# Patient Record
Sex: Female | Born: 1940 | Race: White | Hispanic: No | Marital: Married | Smoking: Never smoker
Health system: Southern US, Community
[De-identification: ages and names within clinical notes are randomized; demographics above are authoritative.]

## PROBLEM LIST (undated history)

## (undated) DIAGNOSIS — R0600 Dyspnea, unspecified: Secondary | ICD-10-CM

## (undated) DIAGNOSIS — M5030 Other cervical disc degeneration, unspecified cervical region: Secondary | ICD-10-CM

## (undated) DIAGNOSIS — F32A Depression, unspecified: Secondary | ICD-10-CM

## (undated) DIAGNOSIS — M5136 Other intervertebral disc degeneration, lumbar region: Secondary | ICD-10-CM

## (undated) DIAGNOSIS — R0609 Other forms of dyspnea: Secondary | ICD-10-CM

## (undated) DIAGNOSIS — Z87442 Personal history of urinary calculi: Secondary | ICD-10-CM

## (undated) DIAGNOSIS — F419 Anxiety disorder, unspecified: Secondary | ICD-10-CM

## (undated) DIAGNOSIS — E785 Hyperlipidemia, unspecified: Secondary | ICD-10-CM

## (undated) DIAGNOSIS — G5600 Carpal tunnel syndrome, unspecified upper limb: Secondary | ICD-10-CM

## (undated) DIAGNOSIS — H269 Unspecified cataract: Secondary | ICD-10-CM

## (undated) DIAGNOSIS — M8580 Other specified disorders of bone density and structure, unspecified site: Secondary | ICD-10-CM

## (undated) DIAGNOSIS — Z9989 Dependence on other enabling machines and devices: Secondary | ICD-10-CM

## (undated) DIAGNOSIS — M1990 Unspecified osteoarthritis, unspecified site: Secondary | ICD-10-CM

## (undated) DIAGNOSIS — R519 Headache, unspecified: Secondary | ICD-10-CM

## (undated) DIAGNOSIS — G43909 Migraine, unspecified, not intractable, without status migrainosus: Secondary | ICD-10-CM

## (undated) DIAGNOSIS — I10 Essential (primary) hypertension: Secondary | ICD-10-CM

## (undated) DIAGNOSIS — K219 Gastro-esophageal reflux disease without esophagitis: Secondary | ICD-10-CM

## (undated) HISTORY — PX: WISDOM TOOTH EXTRACTION: SHX21

## (undated) HISTORY — PX: UPPER GI ENDOSCOPY: SHX6162

## (undated) HISTORY — PX: CATARACT EXTRACTION, BILATERAL: SHX1313

## (undated) HISTORY — PX: COLONOSCOPY: SHX174

## (undated) HISTORY — PX: CHOLECYSTECTOMY: SHX55

## (undated) HISTORY — PX: TONSILLECTOMY: SUR1361

---

## 2004-12-23 ENCOUNTER — Ambulatory Visit: Payer: Self-pay | Admitting: Anesthesiology

## 2005-01-01 ENCOUNTER — Ambulatory Visit: Payer: Self-pay | Admitting: Family Medicine

## 2005-01-06 ENCOUNTER — Ambulatory Visit: Payer: Self-pay | Admitting: Pain Medicine

## 2005-01-20 ENCOUNTER — Ambulatory Visit: Payer: Self-pay | Admitting: Pain Medicine

## 2005-02-17 ENCOUNTER — Ambulatory Visit: Payer: Self-pay | Admitting: Pain Medicine

## 2005-02-23 ENCOUNTER — Ambulatory Visit: Payer: Self-pay | Admitting: Pain Medicine

## 2005-03-09 ENCOUNTER — Ambulatory Visit: Payer: Self-pay | Admitting: Physician Assistant

## 2005-03-18 ENCOUNTER — Ambulatory Visit: Payer: Self-pay | Admitting: Pain Medicine

## 2005-04-01 ENCOUNTER — Ambulatory Visit: Payer: Self-pay | Admitting: Pain Medicine

## 2005-05-11 ENCOUNTER — Ambulatory Visit: Payer: Self-pay | Admitting: Pain Medicine

## 2005-06-01 ENCOUNTER — Ambulatory Visit: Payer: Self-pay | Admitting: Physician Assistant

## 2005-07-19 ENCOUNTER — Ambulatory Visit: Payer: Self-pay | Admitting: Physician Assistant

## 2005-07-22 ENCOUNTER — Ambulatory Visit: Payer: Self-pay | Admitting: Pain Medicine

## 2005-08-25 ENCOUNTER — Ambulatory Visit: Payer: Self-pay | Admitting: Physician Assistant

## 2005-09-01 ENCOUNTER — Encounter: Payer: Self-pay | Admitting: Pain Medicine

## 2005-09-24 ENCOUNTER — Encounter: Payer: Self-pay | Admitting: Pain Medicine

## 2005-12-07 ENCOUNTER — Ambulatory Visit: Payer: Self-pay | Admitting: Cardiovascular Disease

## 2006-01-06 ENCOUNTER — Ambulatory Visit: Payer: Self-pay | Admitting: Internal Medicine

## 2006-02-01 ENCOUNTER — Ambulatory Visit: Payer: Self-pay | Admitting: Physician Assistant

## 2006-03-17 ENCOUNTER — Ambulatory Visit: Payer: Self-pay | Admitting: Physician Assistant

## 2006-03-22 ENCOUNTER — Ambulatory Visit: Payer: Self-pay | Admitting: Pain Medicine

## 2006-04-06 ENCOUNTER — Ambulatory Visit: Payer: Self-pay | Admitting: Pain Medicine

## 2006-06-03 ENCOUNTER — Ambulatory Visit: Payer: Self-pay | Admitting: Physician Assistant

## 2006-06-15 ENCOUNTER — Ambulatory Visit: Payer: Self-pay | Admitting: Internal Medicine

## 2006-07-27 ENCOUNTER — Ambulatory Visit: Payer: Self-pay | Admitting: Gastroenterology

## 2006-12-06 ENCOUNTER — Ambulatory Visit: Payer: Self-pay | Admitting: Physician Assistant

## 2006-12-27 ENCOUNTER — Ambulatory Visit: Payer: Self-pay | Admitting: Physician Assistant

## 2007-01-09 ENCOUNTER — Ambulatory Visit: Payer: Self-pay | Admitting: Internal Medicine

## 2007-01-25 ENCOUNTER — Ambulatory Visit: Payer: Self-pay | Admitting: Physician Assistant

## 2007-02-07 ENCOUNTER — Ambulatory Visit: Payer: Self-pay | Admitting: Pain Medicine

## 2007-02-08 ENCOUNTER — Encounter: Payer: Self-pay | Admitting: Pain Medicine

## 2007-02-22 ENCOUNTER — Ambulatory Visit: Payer: Self-pay | Admitting: Pain Medicine

## 2007-02-23 ENCOUNTER — Encounter: Payer: Self-pay | Admitting: Pain Medicine

## 2007-02-28 ENCOUNTER — Ambulatory Visit: Payer: Self-pay | Admitting: Pain Medicine

## 2007-03-15 ENCOUNTER — Ambulatory Visit: Payer: Self-pay | Admitting: Physician Assistant

## 2007-11-15 ENCOUNTER — Ambulatory Visit: Payer: Self-pay | Admitting: Physician Assistant

## 2007-11-17 ENCOUNTER — Other Ambulatory Visit: Payer: Self-pay

## 2007-11-17 ENCOUNTER — Emergency Department: Payer: Self-pay | Admitting: Emergency Medicine

## 2007-11-28 ENCOUNTER — Ambulatory Visit: Payer: Self-pay | Admitting: Pain Medicine

## 2007-12-13 ENCOUNTER — Ambulatory Visit: Payer: Self-pay | Admitting: Pain Medicine

## 2007-12-20 ENCOUNTER — Ambulatory Visit: Payer: Self-pay | Admitting: Pain Medicine

## 2008-01-01 ENCOUNTER — Ambulatory Visit: Payer: Self-pay | Admitting: Internal Medicine

## 2008-01-17 ENCOUNTER — Ambulatory Visit: Payer: Self-pay | Admitting: Pain Medicine

## 2008-01-23 ENCOUNTER — Ambulatory Visit: Payer: Self-pay | Admitting: Pain Medicine

## 2008-02-07 ENCOUNTER — Ambulatory Visit: Payer: Self-pay | Admitting: Pain Medicine

## 2008-03-04 ENCOUNTER — Ambulatory Visit: Payer: Self-pay | Admitting: Pain Medicine

## 2008-04-02 ENCOUNTER — Ambulatory Visit: Payer: Self-pay | Admitting: Physician Assistant

## 2008-05-07 ENCOUNTER — Ambulatory Visit: Payer: Self-pay | Admitting: Physician Assistant

## 2008-05-16 ENCOUNTER — Ambulatory Visit: Payer: Self-pay | Admitting: Pain Medicine

## 2008-05-30 ENCOUNTER — Ambulatory Visit: Payer: Self-pay | Admitting: Physician Assistant

## 2008-06-13 ENCOUNTER — Ambulatory Visit: Payer: Self-pay | Admitting: Pain Medicine

## 2008-06-27 ENCOUNTER — Ambulatory Visit: Payer: Self-pay | Admitting: Pain Medicine

## 2008-07-11 ENCOUNTER — Ambulatory Visit: Payer: Self-pay | Admitting: Physician Assistant

## 2008-07-18 ENCOUNTER — Ambulatory Visit: Payer: Self-pay | Admitting: Pain Medicine

## 2008-08-06 ENCOUNTER — Ambulatory Visit: Payer: Self-pay | Admitting: Physician Assistant

## 2008-09-17 ENCOUNTER — Encounter: Payer: Self-pay | Admitting: Obstetrics and Gynecology

## 2008-09-24 ENCOUNTER — Encounter: Payer: Self-pay | Admitting: Obstetrics and Gynecology

## 2008-10-25 ENCOUNTER — Encounter: Payer: Self-pay | Admitting: Obstetrics and Gynecology

## 2008-11-25 ENCOUNTER — Encounter: Payer: Self-pay | Admitting: Obstetrics and Gynecology

## 2008-12-23 ENCOUNTER — Encounter: Payer: Self-pay | Admitting: Obstetrics and Gynecology

## 2009-01-23 ENCOUNTER — Encounter: Payer: Self-pay | Admitting: Obstetrics and Gynecology

## 2009-02-22 ENCOUNTER — Encounter: Payer: Self-pay | Admitting: Obstetrics and Gynecology

## 2009-03-25 ENCOUNTER — Ambulatory Visit: Payer: Self-pay | Admitting: Internal Medicine

## 2009-07-08 ENCOUNTER — Ambulatory Visit: Payer: Self-pay | Admitting: Physician Assistant

## 2009-09-11 ENCOUNTER — Ambulatory Visit: Payer: Self-pay | Admitting: Physician Assistant

## 2010-03-27 ENCOUNTER — Ambulatory Visit: Payer: Self-pay | Admitting: Internal Medicine

## 2010-08-31 ENCOUNTER — Ambulatory Visit: Payer: Self-pay | Admitting: Pain Medicine

## 2011-03-01 ENCOUNTER — Ambulatory Visit: Payer: Self-pay | Admitting: Pain Medicine

## 2011-03-08 ENCOUNTER — Ambulatory Visit: Payer: Self-pay | Admitting: Pain Medicine

## 2011-03-11 ENCOUNTER — Ambulatory Visit: Payer: Self-pay | Admitting: Pain Medicine

## 2011-03-29 ENCOUNTER — Ambulatory Visit: Payer: Self-pay | Admitting: Internal Medicine

## 2011-04-05 ENCOUNTER — Ambulatory Visit: Payer: Self-pay | Admitting: Pain Medicine

## 2011-06-21 ENCOUNTER — Ambulatory Visit: Payer: Self-pay | Admitting: Ophthalmology

## 2011-06-29 ENCOUNTER — Ambulatory Visit: Payer: Self-pay | Admitting: Ophthalmology

## 2011-07-12 ENCOUNTER — Ambulatory Visit: Payer: Self-pay | Admitting: Ophthalmology

## 2011-11-08 ENCOUNTER — Ambulatory Visit: Payer: Self-pay | Admitting: Pain Medicine

## 2012-03-06 ENCOUNTER — Ambulatory Visit: Payer: Self-pay | Admitting: Pain Medicine

## 2012-08-29 ENCOUNTER — Ambulatory Visit: Payer: Self-pay | Admitting: Pain Medicine

## 2012-09-18 ENCOUNTER — Ambulatory Visit: Payer: Self-pay | Admitting: Internal Medicine

## 2013-10-09 ENCOUNTER — Ambulatory Visit: Payer: Self-pay | Admitting: Internal Medicine

## 2014-10-23 ENCOUNTER — Ambulatory Visit: Payer: Self-pay | Admitting: Internal Medicine

## 2014-12-06 ENCOUNTER — Ambulatory Visit: Payer: Self-pay | Admitting: Unknown Physician Specialty

## 2015-10-28 ENCOUNTER — Other Ambulatory Visit: Payer: Self-pay | Admitting: Internal Medicine

## 2015-10-28 DIAGNOSIS — Z1231 Encounter for screening mammogram for malignant neoplasm of breast: Secondary | ICD-10-CM

## 2015-11-07 ENCOUNTER — Ambulatory Visit: Payer: Self-pay | Attending: Internal Medicine

## 2016-05-13 ENCOUNTER — Ambulatory Visit: Payer: Self-pay

## 2016-06-01 ENCOUNTER — Ambulatory Visit
Admission: RE | Admit: 2016-06-01 | Discharge: 2016-06-01 | Disposition: A | Discharge status: Home / Self Care | Payer: Medicare Other | Source: Ambulatory Visit | Attending: Internal Medicine | Admitting: Internal Medicine

## 2016-06-01 ENCOUNTER — Other Ambulatory Visit: Payer: Self-pay | Admitting: Internal Medicine

## 2016-06-01 DIAGNOSIS — Z1231 Encounter for screening mammogram for malignant neoplasm of breast: Secondary | ICD-10-CM | POA: Insufficient documentation

## 2017-03-29 DIAGNOSIS — D229 Melanocytic nevi, unspecified: Secondary | ICD-10-CM

## 2017-03-29 HISTORY — DX: Melanocytic nevi, unspecified: D22.9

## 2017-05-11 ENCOUNTER — Other Ambulatory Visit: Payer: Self-pay | Admitting: Internal Medicine

## 2017-05-11 DIAGNOSIS — Z1231 Encounter for screening mammogram for malignant neoplasm of breast: Secondary | ICD-10-CM

## 2017-08-01 ENCOUNTER — Ambulatory Visit
Admission: RE | Admit: 2017-08-01 | Discharge: 2017-08-01 | Disposition: A | Discharge status: Home / Self Care | Payer: Medicare Other | Source: Ambulatory Visit | Attending: Internal Medicine | Admitting: Internal Medicine

## 2017-08-01 DIAGNOSIS — Z1231 Encounter for screening mammogram for malignant neoplasm of breast: Secondary | ICD-10-CM | POA: Diagnosis not present

## 2018-07-31 ENCOUNTER — Other Ambulatory Visit: Payer: Self-pay | Admitting: Internal Medicine

## 2018-07-31 DIAGNOSIS — Z1231 Encounter for screening mammogram for malignant neoplasm of breast: Secondary | ICD-10-CM

## 2018-08-11 ENCOUNTER — Ambulatory Visit
Admission: RE | Admit: 2018-08-11 | Discharge: 2018-08-11 | Disposition: A | Discharge status: Home / Self Care | Payer: Medicare Other | Source: Ambulatory Visit | Attending: Internal Medicine | Admitting: Internal Medicine

## 2018-08-11 DIAGNOSIS — Z1231 Encounter for screening mammogram for malignant neoplasm of breast: Secondary | ICD-10-CM | POA: Diagnosis present

## 2019-04-10 DIAGNOSIS — C4491 Basal cell carcinoma of skin, unspecified: Secondary | ICD-10-CM

## 2019-04-10 HISTORY — DX: Basal cell carcinoma of skin, unspecified: C44.91

## 2019-07-10 ENCOUNTER — Other Ambulatory Visit: Payer: Self-pay | Admitting: Internal Medicine

## 2019-07-10 DIAGNOSIS — Z1231 Encounter for screening mammogram for malignant neoplasm of breast: Secondary | ICD-10-CM

## 2019-12-11 ENCOUNTER — Ambulatory Visit: Payer: Medicare Other | Attending: Internal Medicine

## 2019-12-11 DIAGNOSIS — Z20822 Contact with and (suspected) exposure to covid-19: Secondary | ICD-10-CM

## 2019-12-12 LAB — NOVEL CORONAVIRUS, NAA: SARS-CoV-2, NAA: NOT DETECTED

## 2020-01-16 ENCOUNTER — Other Ambulatory Visit: Payer: Self-pay | Admitting: Internal Medicine

## 2020-01-16 DIAGNOSIS — H539 Unspecified visual disturbance: Secondary | ICD-10-CM

## 2020-01-16 DIAGNOSIS — R410 Disorientation, unspecified: Secondary | ICD-10-CM

## 2020-01-23 ENCOUNTER — Other Ambulatory Visit: Payer: Self-pay

## 2020-01-23 ENCOUNTER — Ambulatory Visit
Admission: RE | Admit: 2020-01-23 | Discharge: 2020-01-23 | Disposition: A | Discharge status: Home / Self Care | Payer: Medicare Other | Source: Ambulatory Visit | Attending: Internal Medicine | Admitting: Internal Medicine

## 2020-01-23 DIAGNOSIS — R410 Disorientation, unspecified: Secondary | ICD-10-CM | POA: Diagnosis present

## 2020-01-23 DIAGNOSIS — H539 Unspecified visual disturbance: Secondary | ICD-10-CM | POA: Diagnosis present

## 2020-02-05 ENCOUNTER — Ambulatory Visit
Admission: RE | Admit: 2020-02-05 | Discharge: 2020-02-05 | Disposition: A | Discharge status: Home / Self Care | Payer: Medicare Other | Source: Ambulatory Visit | Attending: Internal Medicine | Admitting: Internal Medicine

## 2020-02-05 DIAGNOSIS — Z1231 Encounter for screening mammogram for malignant neoplasm of breast: Secondary | ICD-10-CM | POA: Diagnosis present

## 2020-02-11 ENCOUNTER — Other Ambulatory Visit: Payer: Self-pay | Admitting: Internal Medicine

## 2020-02-11 DIAGNOSIS — R921 Mammographic calcification found on diagnostic imaging of breast: Secondary | ICD-10-CM

## 2020-02-11 DIAGNOSIS — R928 Other abnormal and inconclusive findings on diagnostic imaging of breast: Secondary | ICD-10-CM

## 2020-02-18 ENCOUNTER — Ambulatory Visit
Admission: RE | Admit: 2020-02-18 | Discharge: 2020-02-18 | Disposition: A | Discharge status: Home / Self Care | Payer: Medicare Other | Source: Ambulatory Visit | Attending: Internal Medicine | Admitting: Internal Medicine

## 2020-02-18 DIAGNOSIS — R921 Mammographic calcification found on diagnostic imaging of breast: Secondary | ICD-10-CM | POA: Diagnosis present

## 2020-02-18 DIAGNOSIS — R928 Other abnormal and inconclusive findings on diagnostic imaging of breast: Secondary | ICD-10-CM | POA: Insufficient documentation

## 2020-02-19 ENCOUNTER — Other Ambulatory Visit: Payer: Self-pay | Admitting: Internal Medicine

## 2020-02-19 DIAGNOSIS — R921 Mammographic calcification found on diagnostic imaging of breast: Secondary | ICD-10-CM

## 2020-02-19 DIAGNOSIS — R928 Other abnormal and inconclusive findings on diagnostic imaging of breast: Secondary | ICD-10-CM

## 2020-02-21 ENCOUNTER — Ambulatory Visit
Admission: RE | Admit: 2020-02-21 | Discharge: 2020-02-21 | Disposition: A | Discharge status: Home / Self Care | Payer: Medicare Other | Source: Ambulatory Visit | Attending: Internal Medicine | Admitting: Internal Medicine

## 2020-02-21 DIAGNOSIS — R921 Mammographic calcification found on diagnostic imaging of breast: Secondary | ICD-10-CM | POA: Insufficient documentation

## 2020-02-21 DIAGNOSIS — R928 Other abnormal and inconclusive findings on diagnostic imaging of breast: Secondary | ICD-10-CM | POA: Diagnosis not present

## 2020-02-21 HISTORY — PX: BREAST BIOPSY: SHX20

## 2020-02-25 LAB — SURGICAL PATHOLOGY

## 2020-04-15 ENCOUNTER — Ambulatory Visit (INDEPENDENT_AMBULATORY_CARE_PROVIDER_SITE_OTHER): Payer: Medicare Other | Admitting: Dermatology

## 2020-04-15 ENCOUNTER — Other Ambulatory Visit: Payer: Self-pay

## 2020-04-15 DIAGNOSIS — Z1283 Encounter for screening for malignant neoplasm of skin: Secondary | ICD-10-CM | POA: Diagnosis not present

## 2020-04-15 DIAGNOSIS — L853 Xerosis cutis: Secondary | ICD-10-CM

## 2020-04-15 DIAGNOSIS — L700 Acne vulgaris: Secondary | ICD-10-CM | POA: Diagnosis not present

## 2020-04-15 DIAGNOSIS — L578 Other skin changes due to chronic exposure to nonionizing radiation: Secondary | ICD-10-CM

## 2020-04-15 DIAGNOSIS — D2272 Melanocytic nevi of left lower limb, including hip: Secondary | ICD-10-CM

## 2020-04-15 DIAGNOSIS — L570 Actinic keratosis: Secondary | ICD-10-CM

## 2020-04-15 DIAGNOSIS — D225 Melanocytic nevi of trunk: Secondary | ICD-10-CM

## 2020-04-15 DIAGNOSIS — D2362 Other benign neoplasm of skin of left upper limb, including shoulder: Secondary | ICD-10-CM

## 2020-04-15 DIAGNOSIS — L814 Other melanin hyperpigmentation: Secondary | ICD-10-CM

## 2020-04-15 DIAGNOSIS — D229 Melanocytic nevi, unspecified: Secondary | ICD-10-CM

## 2020-04-15 DIAGNOSIS — L219 Seborrheic dermatitis, unspecified: Secondary | ICD-10-CM | POA: Diagnosis not present

## 2020-04-15 DIAGNOSIS — Z85828 Personal history of other malignant neoplasm of skin: Secondary | ICD-10-CM

## 2020-04-15 DIAGNOSIS — D1800 Hemangioma unspecified site: Secondary | ICD-10-CM

## 2020-04-15 DIAGNOSIS — D239 Other benign neoplasm of skin, unspecified: Secondary | ICD-10-CM

## 2020-04-15 DIAGNOSIS — L821 Other seborrheic keratosis: Secondary | ICD-10-CM

## 2020-04-15 MED ORDER — CLOBETASOL PROPIONATE 0.05 % EX SOLN: CUTANEOUS | 3 refills | Status: DC

## 2020-04-15 MED ORDER — CICLOPIROX 1 % EX SHAM: MEDICATED_SHAMPOO | CUTANEOUS | 3 refills | Status: DC

## 2020-04-15 NOTE — Patient Instructions (Addendum)
Recommend daily broad spectrum sunscreen SPF 30+ to sun-exposed areas, reapply every 2 hours as needed. Call for new or changing lesions.  Seborrheic Dermatitis  What is seborrheic dermatitis? Seborrheic (say: seb-oh-ree-ick) dermatitis is a disease that causes flaking of the skin.  It usually affects the scalp.  In teenagers and adults, it is commonly called "dandruff".  In infants, it is referred to as "cradle cap".  Dandruff often appears as scaling on the scalp with or without redness.  On other parts of the body, seborrheic dermatitis tends to produce both redness and scaling.  Other common locations of seborrheic dermatitis include the central face, eyebrows, chest, and the creases of the arms, legs, and groin.  It often causes the skin to look a little greasy, scaly, or flaky. Seborrheic dermatitis can occur at any age.  It often comes and goes and may to be seasonally related, especially in the Northern climates.  What causes seborrheic dermatitis? The exact cause is not known, though yeast of the Malassezia species may be involved.  This organism is normally present on the skin in small numbers, but sometimes its numbers increase, especially in oily skin.  Treatments that reduce the yeast tend to improve seborrheic dermatitis.  How is seborrheic dermatitis treated? The treatment of seborrheic dermatitis depends on its location on the body and the person's age. Seborrheic dermatitis of the scalp (dandruff) in adults and teenagers is usually treated with a medicated shampoo.  Here is a list of the medications that help, and the over-counter shampoos that contain them:  Salicylic acid (Neutrogena T/Sal, Sebulex, Scalpicin, Denorex Extra Strength)  Zinc pyrithione (Head & Shoulders white bottle, Denorex Daily, DHS Zinc, Pantene Pro-V Pyrithione Zinc)  Selenium sulfide (Head & Shoulders blue bottle, Selsun Blue, Exsel Lotion Shampoo, Glo-Sel)  Yahoo tar (Neutrogena T/Gal, Pentrax, Zetar,  Tegrin, Viacom, Therapeutic Denorex)  Ketoconazole (Nizoral)  If you have dandruff, you might start by using one of these shampoos every day until your dandruff is controlled and then keep using it at least twice a week.  Often times your doctor will recommend a rotation of several different medicated shampoos as some will experience a plateau in the effectiveness of any one shampoo.   When you use a dandruff shampoo, rub the shampoo into your wet hair and massage into scalp thoroughly.  Let it stay on your hair and scalp for 5 minutes before rinsing.  If you have involvement in the eyebrows or face, you can lather those areas with the medicated shampoo as well, or use a medicated soap (ZNP-bar, Polytar Soap, SAStid, or sulfur soap).    If the wash or shampoo alone does not help, your doctor might want you to use a prescription medication once or twice a day.  Leave-in medications for the scalp are best applied by massaging into the scalp immediately after towel drying your hair, but may be applied even if you have not washed your hair.  Seborrheic dermatitis in infants usually clears up by age 5 -24 months.  It may develop in the diaper area where it might be confused with diaper rash.  For milder cases you can try gently brushing out scales with a soft brush.  This is best done immediately after washing with a non-medicated baby shampoo Wynetta Emery and Royce Macadamia, etc.).  Your doctor may recommend a medicated shampoo or a prescription topical medication.

## 2020-04-15 NOTE — Progress Notes (Signed)
Follow-Up Visit   Subjective  [place] is a 79 y.o. female who presents for the following: TBSE.  Patient presents today for annual  TBSE, patient is having itchy scalp with some minor flaking. Patient has a h/o BCC L lower pretibia 04/10/19  The following portions of the chart were reviewed this encounter and updated as appropriate:      Review of Systems:  No other skin or systemic complaints except as noted in HPI or Assessment and Plan.  Objective  Well appearing patient in no apparent distress; mood and affect are within normal limits.  A full examination was performed including scalp, head, eyes, ears, nose, lips, neck, chest, axillae, abdomen, back, buttocks, bilateral upper extremities, bilateral lower extremities, hands, feet, fingers, toes, fingernails, and toenails. All findings within normal limits unless otherwise noted below.  Objective  Right Upper Back: Large open comedon  Objective  mid sternum: Erythematous thin papules/macules with gritty scale.   Objective  Left Shoulder - Anterior: Firm pink/brown papulenodule with dimple sign.   Objective  Right Inferior Breast: 1.3 x 0.9 mm pink brown flat papule R inferior breast  Left inferior  Hip: 2 mm med brown papule  Objective  Right Inferior Breast: 6 mm waxy tan papule darker edge  Objective  Left Lower pretibia: Well healed scar with no evidence of recurrence.   Objective  Mid Frontal Scalp: Mild erythema scalp   Assessment & Plan  Open comedone Right Upper Back  Benign- extracted  AK (actinic keratosis) mid sternum  Cryotherapy today Prior to procedure, discussed risks of blister formation, small wound, skin dyspigmentation, or rare scar following cryotherapy.   Will recheck on follow up  Destruction of lesion - mid sternum  Destruction method: cryotherapy   Informed consent: discussed and consent obtained   Lesion destroyed using liquid nitrogen: Yes   Region frozen  until ice ball extended beyond lesion: Yes   Outcome: patient tolerated procedure well with no complications   Post-procedure details: wound care instructions given    Dermatofibroma Left Shoulder - Anterior  Benign, observe.     Nevus (2) Right Inferior Breast; Left inferior  Hip  Benign-appearing.  Observation.  Call clinic for new or changing moles.  Recommend daily use of broad spectrum spf 30+ sunscreen to sun-exposed areas.     Seborrheic keratosis Right Inferior Breast  Reassured benign age-related growth.  Recommend observation.  Discussed cryotherapy if spot(s) become irritated or inflamed.    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 Observe for recurrence. Call clinic for new or changing lesions.  Recommend regular skin exams, daily broad-spectrum spf 30+ sunscreen use, and photoprotection.     Seborrheic dermatitis Mid Frontal Scalp  improving Continue  Ciclopirox shampoo, may increase frequency Continue Clobetasol Sol. qd/bid prn itch   Lentigines - Scattered tan macules - Discussed due to sun exposure - Benign, observe - Call for any changes  Seborrheic Keratoses - Stuck-on, waxy, tan-brown papules and plaques  - Discussed benign etiology and prognosis. - Observe - Call for any changes  Melanocytic Nevi - Tan-brown and/or pink-flesh-colored symmetric macules and papules - Benign appearing on exam today - Observation - Call clinic for new or changing moles - Recommend daily use of broad spectrum spf 30+ sunscreen to sun-exposed areas.   Hemangiomas - Red papules - Discussed benign nature - Observe - Call for any changes  Actinic Damage - diffuse scaly erythematous macules with underlying dyspigmentation - Recommend daily broad  spectrum sunscreen SPF 30+ to sun-exposed areas, reapply every 2 hours as needed.  - Call for new or changing lesions.  Xerosis - diffuse xerotic patches - recommend gentle, hydrating skin  care - gentle skin care handout given   Skin cancer screening performed today.   Return in about 1 year (around 04/15/2021) for TBSE.  I, Donzetta Kohut, CMA, am acting as scribe for Brendolyn Patty, MD .  Documentation: I have reviewed the above documentation for accuracy and completeness, and I agree with the above.  Brendolyn Patty MD

## 2020-04-30 ENCOUNTER — Other Ambulatory Visit: Payer: Self-pay | Admitting: Otolaryngology

## 2020-04-30 DIAGNOSIS — K219 Gastro-esophageal reflux disease without esophagitis: Secondary | ICD-10-CM

## 2020-04-30 DIAGNOSIS — R1312 Dysphagia, oropharyngeal phase: Secondary | ICD-10-CM

## 2020-04-30 DIAGNOSIS — R490 Dysphonia: Secondary | ICD-10-CM

## 2020-05-19 ENCOUNTER — Ambulatory Visit
Admission: RE | Admit: 2020-05-19 | Discharge: 2020-05-19 | Disposition: A | Discharge status: Home / Self Care | Payer: Medicare Other | Source: Ambulatory Visit | Attending: Otolaryngology | Admitting: Otolaryngology

## 2020-05-19 ENCOUNTER — Other Ambulatory Visit: Payer: Self-pay

## 2020-05-19 DIAGNOSIS — K219 Gastro-esophageal reflux disease without esophagitis: Secondary | ICD-10-CM | POA: Insufficient documentation

## 2020-05-19 DIAGNOSIS — R1312 Dysphagia, oropharyngeal phase: Secondary | ICD-10-CM | POA: Insufficient documentation

## 2020-05-19 DIAGNOSIS — R490 Dysphonia: Secondary | ICD-10-CM | POA: Diagnosis present

## 2020-05-19 NOTE — Therapy (Signed)
[place], [place], [address] Phone: [phone]   Fax:     Modified Barium Swallow  Patient Details  Name: Kristen Singh MRN: [id number] Date of Birth: Mar 24, 1941 No data recorded  Encounter Date: 05/19/2020   End of Session - 05/19/20 1315    Visit Number 1    Number of Visits 1    Date for SLP Re-Evaluation 05/19/20    SLP Start Time 7408    SLP Stop Time  1315    SLP Time Calculation (min) 40 min    Activity Tolerance Patient tolerated treatment well           Past Medical History:  Diagnosis Date  . Atypical mole 03/29/2017   right medial shoulder/mild  . Basal cell carcinoma 04/10/2019   left lower pretibial    Past Surgical History:  Procedure Laterality Date  . BREAST BIOPSY Right 02/21/2020   affirm bx calcs, x marker, path pending    There were no vitals filed for this visit.   Subjective: Patient behavior: (alertness, ability to follow instructions, etc.): The patient is alert, able to verbalize her complaints, and follow directions.  Chief complaint: the patient complains of thick mucus in her throat    Objective:  Radiological Procedure: A videoflouroscopic evaluation of oral-preparatory, reflex initiation, and pharyngeal phases of the swallow was performed; as well as a screening of the upper esophageal phase.  I. POSTURE: Upright in MBS chair  II. VIEW: Lateral  III. COMPENSATORY STRATEGIES: N/A  IV. BOLUSES ADMINISTERED:   Thin Liquid: 1 small, 3 consecutive   Nectar-thick Liquid: 1 moderate   Honey-thick Liquid: DNT   Puree: 1 teaspoon, 1 heaping teaspoon   Mechanical Soft: 1/4 graham cracker in applesauce  V. RESULTS OF EVALUATION: A. ORAL PREPARATORY PHASE: (The lips, tongue, and velum are observed for strength and coordination)       **Overall Severity Rating: within normal limits  B. SWALLOW INITIATION/REFLEX: (The reflex is normal if  "triggered" by the time the bolus reached the base of the tongue)  **Overall Severity Rating: within normal limits  C. PHARYNGEAL PHASE: (Pharyngeal function is normal if the bolus shows rapid, smooth, and continuous transit through the pharynx and there is no pharyngeal residue after the swallow)  **Overall Severity Rating: Minimal; incomplete epiglottic inversion with trace vallecular residue  D. LARYNGEAL PENETRATION: (Material entering into the laryngeal inlet/vestibule but not aspirated) None  E. ASPIRATION: None  F. ESOPHAGEAL PHASE: (Screening of the upper esophagus) no abnormality within the viewable cervical esophagus  ASSESSMENT: This 79 year old woman; with dysphonia, GERD, dysphagia, and complaint of thick mucus in her throat; is presenting with minimal oropharyngeal dysphagia characterized by incomplete epiglottic inversion with trace vallecular residue post swallow.  Oral control of the bolus including oral hold, rotary mastication, and anterior to posterior transfer is within functional limits.   Timing of pharyngeal swallow initiation is within normal limits.  Other aspects of the pharyngeal stage of swallowing including tongue base retraction, hyolaryngeal excursion, and duration/amplitude of UES opening are within normal limits.  There is no observed laryngeal penetration or tracheal aspiration.  The patient is not at risk for prandial aspiration.  The patient was assured that her swallowing is safe, and the thick mucus is just a nuisance in terms of swallowing.   PLAN/RECOMMENDATIONS:   A. Diet: Regular   B. Swallowing Precautions: Reflux precautions   C. Recommended consultation to: follow up with MDs as recommended  D. Therapy recommendations: speech therapy is not indicated for dysphagia    E. Results and recommendations were discussed with the patient immediately following the study and the final report routed to the referring MD   Oropharyngeal dysphagia - Plan: DG  SWALLOW FUNC OP MEDICARE SPEECH PATH, DG SWALLOW FUNC OP MEDICARE SPEECH PATH  Gastroesophageal reflux disease, unspecified whether esophagitis present - Plan: DG SWALLOW FUNC OP MEDICARE SPEECH PATH, DG SWALLOW FUNC OP MEDICARE SPEECH PATH  Hoarseness - Plan: DG SWALLOW FUNC OP MEDICARE SPEECH PATH, DG SWALLOW FUNC OP MEDICARE SPEECH PATH        Problem List There are no problems to display for this patient.  Kristen Sea, MS/CCC- SLP  Lou Miner 05/19/2020, 1:16 PM  [facility] [place], [place], [address] Phone: [phone]   Fax:     Name: Kristen Singh MRN: [id number] Date of Birth: [birth year]

## 2020-07-29 ENCOUNTER — Other Ambulatory Visit: Payer: Self-pay | Admitting: Internal Medicine

## 2020-07-29 DIAGNOSIS — R928 Other abnormal and inconclusive findings on diagnostic imaging of breast: Secondary | ICD-10-CM

## 2020-09-26 ENCOUNTER — Ambulatory Visit
Admission: RE | Admit: 2020-09-26 | Discharge: 2020-09-26 | Disposition: A | Discharge status: Home / Self Care | Payer: Medicare Other | Source: Ambulatory Visit | Attending: Internal Medicine | Admitting: Internal Medicine

## 2020-09-26 ENCOUNTER — Other Ambulatory Visit: Payer: Self-pay

## 2020-09-26 DIAGNOSIS — R928 Other abnormal and inconclusive findings on diagnostic imaging of breast: Secondary | ICD-10-CM | POA: Insufficient documentation

## 2020-09-30 ENCOUNTER — Other Ambulatory Visit: Payer: Self-pay | Admitting: Internal Medicine

## 2020-09-30 DIAGNOSIS — R928 Other abnormal and inconclusive findings on diagnostic imaging of breast: Secondary | ICD-10-CM

## 2020-09-30 DIAGNOSIS — R921 Mammographic calcification found on diagnostic imaging of breast: Secondary | ICD-10-CM

## 2021-01-27 ENCOUNTER — Other Ambulatory Visit: Payer: Self-pay | Admitting: Internal Medicine

## 2021-01-27 DIAGNOSIS — R921 Mammographic calcification found on diagnostic imaging of breast: Secondary | ICD-10-CM

## 2021-03-10 ENCOUNTER — Ambulatory Visit
Admission: RE | Admit: 2021-03-10 | Discharge: 2021-03-10 | Disposition: A | Discharge status: Home / Self Care | Payer: Medicare Other | Source: Ambulatory Visit | Attending: Internal Medicine | Admitting: Internal Medicine

## 2021-03-10 ENCOUNTER — Other Ambulatory Visit: Payer: Self-pay

## 2021-03-10 DIAGNOSIS — R921 Mammographic calcification found on diagnostic imaging of breast: Secondary | ICD-10-CM | POA: Diagnosis not present

## 2021-03-12 ENCOUNTER — Other Ambulatory Visit: Payer: Self-pay | Admitting: Internal Medicine

## 2021-03-12 DIAGNOSIS — R921 Mammographic calcification found on diagnostic imaging of breast: Secondary | ICD-10-CM

## 2021-03-12 DIAGNOSIS — R928 Other abnormal and inconclusive findings on diagnostic imaging of breast: Secondary | ICD-10-CM

## 2021-04-06 ENCOUNTER — Ambulatory Visit
Admission: RE | Admit: 2021-04-06 | Discharge: 2021-04-06 | Disposition: A | Discharge status: Home / Self Care | Payer: Medicare Other | Source: Ambulatory Visit | Attending: Internal Medicine | Admitting: Internal Medicine

## 2021-04-06 ENCOUNTER — Other Ambulatory Visit: Payer: Self-pay

## 2021-04-06 DIAGNOSIS — N6091 Unspecified benign mammary dysplasia of right breast: Secondary | ICD-10-CM

## 2021-04-06 DIAGNOSIS — R928 Other abnormal and inconclusive findings on diagnostic imaging of breast: Secondary | ICD-10-CM

## 2021-04-06 DIAGNOSIS — R921 Mammographic calcification found on diagnostic imaging of breast: Secondary | ICD-10-CM | POA: Diagnosis present

## 2021-04-06 HISTORY — DX: Unspecified benign mammary dysplasia of right breast: N60.91

## 2021-04-06 HISTORY — PX: BREAST BIOPSY: SHX20

## 2021-04-07 LAB — SURGICAL PATHOLOGY

## 2021-04-08 ENCOUNTER — Ambulatory Visit: Payer: Self-pay | Admitting: General Surgery

## 2021-04-08 NOTE — H&P (View-Only) (Signed)
PATIENT PROFILE: Kristen Singh is a 80 y.o. female who presents to the Clinic for consultation at the request of Dr. Caryl Comes for evaluation of right breast atypical ductal hyperplasia.  PCP:  Cheryll Cockayne, MD  HISTORY OF PRESENT ILLNESS: Kristen Singh reports she has been having diagnostic mammogram due to dense breasts and calcifications.  She had diagnostic mammogram that shows 2 focal areas of calcifications.  One measured 6 mm and the other 1 measured 3 mm on the inner upper quadrant.  She had core needle biopsy.  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  Patient denies breast pain.  She denies any palpable masses, skin changes, any nipple discharge.  Family history of breast cancer: maternal grandmother, two sister Family history of other cancers: dad (esophageal) Menarche: 6 grade Menopause: 1992 Used OCP: yes Used estrogen and progesterone therapy: estrogen replacement therapy (7-9 years)  History of Radiation to the chest: Denies Number of pregnancies: 3 Age: 30 Previous breast biopsy: Yes  PROBLEM LIST: Problem List  Date Reviewed: 01/26/2021          Noted   Benign essential HTN 12/02/2016   Other chest pain 11/09/2016   Abnormal cardiovascular stress test 11/02/2016   Overview    1/18; stress echo shows ECG changes without symptoms; echo suggests inferolateral ischemia.  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   GERD (gastroesophageal reflux disease) 10/30/2014   Pure hypercholesterolemia 10/09/2014   Arthritis Unknown   Osteopenia Unknown       GENERAL REVIEW OF SYSTEMS:   General ROS: negative for - chills, fatigue, fever, weight gain or weight loss Allergy and Immunology ROS: negative for - hives  Hematological and Lymphatic ROS: negative for - bleeding problems or bruising, negative for palpable  nodes Endocrine ROS: negative for - heat or cold intolerance, hair changes Respiratory ROS: negative for - cough, shortness of breath or wheezing Cardiovascular ROS: Positive for chest pain  GI ROS: negative for nausea, vomiting, abdominal pain, diarrhea, constipation Musculoskeletal ROS: negative for - joint swelling or muscle pain Neurological ROS: negative for - confusion, syncope Dermatological ROS: negative for pruritus and rash Psychiatric: negative for anxiety, depression, difficulty sleeping and memory loss  MEDICATIONS: Current Outpatient Medications  Medication Sig Dispense Refill   ACETAMINOPHEN (TYLENOL ORAL) Take by mouth once daily as needed.     alendronate (FOSAMAX) 70 MG tablet TAKE 1 TAB BY MOUTH EVERY 7 DAYS TAKE WITH FULL GLASS OF WATER. DO NOT LIE DOWN FOR THE NEXT 30 MIN. 12 tablet 4   ALPRAZolam (XANAX) 0.25 MG tablet Take 1 tablet (0.25 mg total) by mouth once daily as needed for Anxiety 20 tablet 2   ASPIRIN ORAL Take 81 mg by mouth once daily       CALCIUM CARBONATE (TUMS ORAL) Take by mouth. As needed     CALCIUM CITRATE/VITAMIN D3 (CITRACAL REGULAR ORAL) Take 1,200 mg by mouth once daily.     cetirizine (ZYRTEC) 10 mg capsule Take 10 mg by mouth once daily.     citalopram (CELEXA) 10 MG tablet Take 10 mg by mouth once daily     clobetasoL (CORMAX) 0.05 % external solution APPLY TO SKIN AS DIRECTED, APPLY TO AFFECTED AREA OF SCALP EACH DAY NOT SHAMPOOING SCALP     DULoxetine (CYMBALTA) 60 MG DR capsule TAKE 1 CAPSULE BY MOUTH EVERY DAY 90 capsule 1  fluticasone propionate (FLONASE) 50 mcg/actuation nasal spray SPRAY 2 SPRAYS INTO EACH NOSTRIL EVERY DAY 48 mL 3   folic acid (FOLVITE) 161 MCG tablet Take 800 mcg by mouth once daily.     gabapentin (NEURONTIN) 800 MG tablet TAKE 1 TABLET (800 MG TOTAL) BY MOUTH 2 (TWO) TIMES DAILY 180 tablet 3   hydroCHLOROthiazide (HYDRODIURIL) 25 MG tablet Take 1 tablet (25 mg total) by mouth once daily 30 tablet 11   IPRATROPIUM  BROMIDE NASAL by Nasal route     L.acidoph, paracasei,B. lactis (ENVIVE) 12 bilion cell Cap Take by mouth     metoprolol tartrate (LOPRESSOR) 50 MG tablet TAKE 1 TABLET BY MOUTH TWICE A DAY 180 tablet 3   multivitamin tablet Take 1 tablet by mouth once daily.     omeprazole (PRILOSEC) 20 MG DR capsule TAKE 1 CAPSULE BY MOUTH EVERY DAY 90 capsule 1   pravastatin (PRAVACHOL) 40 MG tablet TAKE 1 TABLET BY MOUTH EVERY DAY AT NIGHT 90 tablet 1   simethicone 125 mg Tab      No current facility-administered medications for this visit.    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 Previously evaluated by Orthopedics. Followed by Dr. Dossie Arbour at pain management clinic.  Radiofrequency ablation 5/09   Carpal tunnel syndrome    Cataract cortical, senile 2015   cataract surgery   Depression    evaluated by Dr. 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   GERD (gastroesophageal reflux disease) 10/30/2014   Hyperlipidemia    Hypertension 2006   taking Metoprolol   Migraine    Mononeuropathy due to underlying disease 10/28/2015   Osteopenia    history of fractures, alendronate ordered 2014   Renal stone     PAST SURGICAL HISTORY: Past Surgical History:  Procedure Laterality Date   CATARACT EXTRACTION     bilateral   CHOLECYSTECTOMY     COLONOSCOPY  2005   per patient   COLONOSCOPY  12/06/2014   Mucosa w/Dark Dots in Anal Area: Needs flex sig in 6 months to check tissue per RTE (05/2015); OV made 06/12/2015 @ 10am w/Kim Jerelene Redden NP (dw)   EGD  07/27/2006   [facility] Norris)   EGD  12/06/2014   No repeat per RTE   TONSILLECTOMY  1951 or 1952     FAMILY HISTORY: Family History  Problem Relation Age of Onset   Breast cancer Sister    Stomach cancer Father    Obesity Father    Skin cancer Father    Alzheimer's  disease Mother        dementia   Depression Mother        attempted suicide   High blood pressure (Hypertension) Mother    Osteoarthritis Mother    Skin cancer Mother    Stroke Mother        multiple mini-strokes   Asthma Sister        childhood   Breast cancer Sister    Obesity Paternal Aunt    Osteoarthritis Sister    Stroke Maternal Grandfather    Breast cancer Sister      SOCIAL HISTORY: Social History   Socioeconomic History   Marital status: Married  Tobacco Use   Smoking status: Former Smoker    Packs/day: 0.00    Years:  21.00    Pack years: 0.00    Types: Cigarettes    Start date: 10/25/1957    Quit date: 10/25/1979    Years since quitting: 41.4   Smokeless tobacco: Never Used   Tobacco comment: tried--maybe smoked 1 pack total from 1959 to 1980s  Vaping Use   Vaping Use: Never used  Substance and Sexual Activity   Alcohol use: Yes    Alcohol/week: 0.0 standard drinks    Comment: 1 or 2  - 4 ounce glasses per week   Drug use: Never   Sexual activity: Not Currently    Partners: Male    Birth control/protection: Condom, Pill, None    Comment: husband only    PHYSICAL EXAM: Vitals:   04/08/21 0923  BP: 131/71  Pulse: 70   Body mass index is 27.81 kg/m. Weight: 71.2 kg (157 lb)   GENERAL: Alert, active, oriented x3  HEENT: Pupils equal reactive to light. Extraocular movements are intact. Sclera clear. Palpebral conjunctiva normal red color.Pharynx clear.  NECK: Supple with no palpable mass and no adenopathy.  LUNGS: Sound clear with no rales rhonchi or wheezes.  HEART: Regular rhythm S1 and S2 without murmur.  BREAST: breasts appear normal, no suspicious masses, no skin or nipple changes or axillary nodes.  ABDOMEN: Soft and depressible, nontender with no palpable mass, no hepatomegaly.  EXTREMITIES: Well-developed well-nourished symmetrical with no dependent edema.  NEUROLOGICAL: Awake alert oriented, facial expression symmetrical, moving  all extremities.  REVIEW OF DATA: I have reviewed the following data today: Initial consult on 04/08/2021  Component Date Value   WBC (White Blood Cell Co* 04/08/2021 8.3    RBC (Red Blood Cell Coun* 04/08/2021 4.35    Hemoglobin 04/08/2021 13.6    Hematocrit 04/08/2021 40.5    MCV (Mean Corpuscular Vo* 04/08/2021 93.1    MCH (Mean Corpuscular He* 04/08/2021 31.3 (!)   MCHC (Mean Corpuscular H* 04/08/2021 33.6    Platelet Count 04/08/2021 272    RDW-CV (Red Cell Distrib* 04/08/2021 12.5    MPV (Mean Platelet Volum* 04/08/2021 9.5    Neutrophils 04/08/2021 3.86    Lymphocytes 04/08/2021 2.99    Monocytes 04/08/2021 1.21    Eosinophils 04/08/2021 0.21    Basophils 04/08/2021 0.04    Neutrophil % 04/08/2021 46.4    Lymphocyte % 04/08/2021 35.9    Monocyte % 04/08/2021 14.5 (!)   Eosinophil % 04/08/2021 2.5    Basophil% 04/08/2021 0.5    Immature Granulocyte % 04/08/2021 0.2    Immature Granulocyte Cou* 04/08/2021 0.02   Appointment on 01/19/2021  Component Date Value   Glucose 01/19/2021 97    Sodium 01/19/2021 143    Potassium 01/19/2021 3.4 (!)   Chloride 01/19/2021 100    Carbon Dioxide (CO2) 01/19/2021 35.6 (!)   Urea Nitrogen (BUN) 01/19/2021 18    Creatinine 01/19/2021 0.8    Glomerular Filtration Ra* 01/19/2021 69    Calcium 01/19/2021 9.7    AST  01/19/2021 24    ALT  01/19/2021 24    Alk Phos (alkaline Phosp* 01/19/2021 42    Albumin 01/19/2021 4.3    Bilirubin, Total 01/19/2021 0.4    Protein, Total 01/19/2021 6.7    A/G Ratio 01/19/2021 1.8    WBC (White Blood Cell Co* 01/19/2021 6.9    RBC (Red Blood Cell Coun* 01/19/2021 4.47    Hemoglobin 01/19/2021 14.1    Hematocrit 01/19/2021 42.2    MCV (Mean Corpuscular Vo* 01/19/2021 94.4    MCH (Mean Corpuscular  He* 01/19/2021 31.5 (!)   MCHC (Mean Corpuscular H* 01/19/2021 33.4    Platelet Count 01/19/2021 307    RDW-CV (Red Cell Distrib* 01/19/2021 12.6    MPV (Mean Platelet Volum* 01/19/2021 9.5     Neutrophils 01/19/2021 2.68    Lymphocytes 01/19/2021 3.16    Monocytes 01/19/2021 0.86    Eosinophils 01/19/2021 0.13    Basophils 01/19/2021 0.05    Neutrophil % 01/19/2021 38.9    Lymphocyte % 01/19/2021 45.9    Monocyte % 01/19/2021 12.5    Eosinophil % 01/19/2021 1.9    Basophil% 01/19/2021 0.7    Immature Granulocyte % 01/19/2021 0.1    Immature Granulocyte Cou* 01/19/2021 0.01    Cholesterol, Total 01/19/2021 182    Triglyceride 01/19/2021 148    HDL (High Density Lipopr* 01/19/2021 65.7    LDL Calculated 01/19/2021 87    VLDL Cholesterol 01/19/2021 30    Cholesterol/HDL Ratio 01/19/2021 2.8    Creatinine, Random Urine 01/19/2021 52.1    Urine Albumin, Random 01/19/2021 <7    Urine Albumin/Creatinine* 01/19/2021 <13.4    Thyroid Stimulating Horm* 01/19/2021 3.297    Color 01/19/2021 Yellow    Clarity 01/19/2021 Clear    Specific Gravity 01/19/2021 1.010    pH, Urine 01/19/2021 6.0    Protein, Urinalysis 01/19/2021 Negative    Glucose, Urinalysis 01/19/2021 Negative    Ketones, Urinalysis 01/19/2021 Negative    Blood, Urinalysis 01/19/2021 Negative    Nitrite, Urinalysis 01/19/2021 Negative    Leukocyte Esterase, Urin* 01/19/2021 Negative    White Blood Cells, Urina* 01/19/2021 0-3    Red Blood Cells, Urinaly* 01/19/2021 None Seen    Bacteria, Urinalysis 01/19/2021 Rare (!)   Squamous Epithelial Cell* 01/19/2021 None Seen      ASSESSMENT: Ms. Cavalieri is a 80 y.o. female presenting for consultation for right breast atypical ductal hyperplasia.    Patient was oriented again about the pathology results. Surgical alternatives were discussed with patient including partial vs total mastectomy. Surgical technique and post operative care was discussed with patient. Risk of surgery was discussed with patient including but not limited to: wound infection, seroma, hematoma, brachial plexopathy, mondor's disease (thrombosis of small veins of breast), chronic wound pain, breast  lymphedema, altered sensation to the nipple and cosmesis among others.   Patient was oriented that atypical ductal hyperplasia and lobular neoplasia are benign findings on the proven otherwise.  They have a 10 to 20% risk of upgrading to DCIS or invasive mammary carcinoma.  They were oriented that the goal of the surgery is to rule out malignancy and not treatment for premalignant or any cancer at this moment.  I also mention the possibility of having the discussion with medical oncology for treatment with antiestrogen therapy to decrease the risk of breast cancer in the future.  Due to patient complaining of chest pain previously evaluated by cardiology she requested cardiac clearance with her cardiologist before proceeding with surgery.  Atypical ductal hyperplasia of right breast [N60.91]  PLAN: 1.  Needle guided (bracketed guidewires) excisional biopsy of the right breast (19125) 2.  CBC, CMP 3.  Cardiology clearance 4.  Hold aspirin 5 days before surgery 5.  Contact us if you have any concern.  Patient and her husband verbalized understanding, all questions were answered, and were agreeable with the plan outlined above.     Herbert Pun, MD  Electronically signed by Herbert Pun, MD

## 2021-04-08 NOTE — H&P (Signed)
PATIENT PROFILE: Kristen Singh is a 80 y.o. female who presents to the Clinic for consultation at the request of Dr. Caryl Comes for evaluation of right breast atypical ductal hyperplasia.  PCP:  Cheryll Cockayne, MD  HISTORY OF PRESENT ILLNESS: Kristen Singh reports she has been having diagnostic mammogram due to dense breasts and calcifications.  She had diagnostic mammogram that shows 2 focal areas of calcifications.  One measured 6 mm and the other 1 measured 3 mm on the inner upper quadrant.  She had core needle biopsy.  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  Patient denies breast pain.  She denies any palpable masses, skin changes, any nipple discharge.  Family history of breast cancer: maternal grandmother, two sister Family history of other cancers: dad (esophageal) Menarche: 6 grade Menopause: 1992 Used OCP: yes Used estrogen and progesterone therapy: estrogen replacement therapy (7-9 years)  History of Radiation to the chest: Denies Number of pregnancies: 3 Age: 30 Previous breast biopsy: Yes  PROBLEM LIST: Problem List  Date Reviewed: 01/26/2021          Noted   Benign essential HTN 12/02/2016   Other chest pain 11/09/2016   Abnormal cardiovascular stress test 11/02/2016   Overview    1/18; stress echo shows ECG changes without symptoms; echo suggests inferolateral ischemia.  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   GERD (gastroesophageal reflux disease) 10/30/2014   Pure hypercholesterolemia 10/09/2014   Arthritis Unknown   Osteopenia Unknown       GENERAL REVIEW OF SYSTEMS:   General ROS: negative for - chills, fatigue, fever, weight gain or weight loss Allergy and Immunology ROS: negative for - hives  Hematological and Lymphatic ROS: negative for - bleeding problems or bruising, negative for palpable  nodes Endocrine ROS: negative for - heat or cold intolerance, hair changes Respiratory ROS: negative for - cough, shortness of breath or wheezing Cardiovascular ROS: Positive for chest pain  GI ROS: negative for nausea, vomiting, abdominal pain, diarrhea, constipation Musculoskeletal ROS: negative for - joint swelling or muscle pain Neurological ROS: negative for - confusion, syncope Dermatological ROS: negative for pruritus and rash Psychiatric: negative for anxiety, depression, difficulty sleeping and memory loss  MEDICATIONS: Current Outpatient Medications  Medication Sig Dispense Refill   ACETAMINOPHEN (TYLENOL ORAL) Take by mouth once daily as needed.     alendronate (FOSAMAX) 70 MG tablet TAKE 1 TAB BY MOUTH EVERY 7 DAYS TAKE WITH FULL GLASS OF WATER. DO NOT LIE DOWN FOR THE NEXT 30 MIN. 12 tablet 4   ALPRAZolam (XANAX) 0.25 MG tablet Take 1 tablet (0.25 mg total) by mouth once daily as needed for Anxiety 20 tablet 2   ASPIRIN ORAL Take 81 mg by mouth once daily       CALCIUM CARBONATE (TUMS ORAL) Take by mouth. As needed     CALCIUM CITRATE/VITAMIN D3 (CITRACAL REGULAR ORAL) Take 1,200 mg by mouth once daily.     cetirizine (ZYRTEC) 10 mg capsule Take 10 mg by mouth once daily.     citalopram (CELEXA) 10 MG tablet Take 10 mg by mouth once daily     clobetasoL (CORMAX) 0.05 % external solution APPLY TO SKIN AS DIRECTED, APPLY TO AFFECTED AREA OF SCALP EACH DAY NOT SHAMPOOING SCALP     DULoxetine (CYMBALTA) 60 MG DR capsule TAKE 1 CAPSULE BY MOUTH EVERY DAY 90 capsule 1  fluticasone propionate (FLONASE) 50 mcg/actuation nasal spray SPRAY 2 SPRAYS INTO EACH NOSTRIL EVERY DAY 48 mL 3   folic acid (FOLVITE) 161 MCG tablet Take 800 mcg by mouth once daily.     gabapentin (NEURONTIN) 800 MG tablet TAKE 1 TABLET (800 MG TOTAL) BY MOUTH 2 (TWO) TIMES DAILY 180 tablet 3   hydroCHLOROthiazide (HYDRODIURIL) 25 MG tablet Take 1 tablet (25 mg total) by mouth once daily 30 tablet 11   IPRATROPIUM  BROMIDE NASAL by Nasal route     L.acidoph, paracasei,B. lactis (ENVIVE) 12 bilion cell Cap Take by mouth     metoprolol tartrate (LOPRESSOR) 50 MG tablet TAKE 1 TABLET BY MOUTH TWICE A DAY 180 tablet 3   multivitamin tablet Take 1 tablet by mouth once daily.     omeprazole (PRILOSEC) 20 MG DR capsule TAKE 1 CAPSULE BY MOUTH EVERY DAY 90 capsule 1   pravastatin (PRAVACHOL) 40 MG tablet TAKE 1 TABLET BY MOUTH EVERY DAY AT NIGHT 90 tablet 1   simethicone 125 mg Tab      No current facility-administered medications for this visit.    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 Previously evaluated by Orthopedics. Followed by Dr. Dossie Arbour at pain management clinic.  Radiofrequency ablation 5/09   Carpal tunnel syndrome    Cataract cortical, senile 2015   cataract surgery   Depression    evaluated by Dr. 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   GERD (gastroesophageal reflux disease) 10/30/2014   Hyperlipidemia    Hypertension 2006   taking Metoprolol   Migraine    Mononeuropathy due to underlying disease 10/28/2015   Osteopenia    history of fractures, alendronate ordered 2014   Renal stone     PAST SURGICAL HISTORY: Past Surgical History:  Procedure Laterality Date   CATARACT EXTRACTION     bilateral   CHOLECYSTECTOMY     COLONOSCOPY  2005   per patient   COLONOSCOPY  12/06/2014   Mucosa w/Dark Dots in Anal Area: Needs flex sig in 6 months to check tissue per RTE (05/2015); OV made 06/12/2015 @ 10am w/Kim Jerelene Redden NP (dw)   EGD  07/27/2006   [facility])   EGD  12/06/2014   No repeat per RTE   TONSILLECTOMY  1951 or 1952     FAMILY HISTORY: Family History  Problem Relation Age of Onset   Breast cancer Sister    Stomach cancer Father    Obesity Father    Skin cancer Father    Alzheimer's  disease Mother        dementia   Depression Mother        attempted suicide   High blood pressure (Hypertension) Mother    Osteoarthritis Mother    Skin cancer Mother    Stroke Mother        multiple mini-strokes   Asthma Sister        childhood   Breast cancer Sister    Obesity Paternal Aunt    Osteoarthritis Sister    Stroke Maternal Grandfather    Breast cancer Sister      SOCIAL HISTORY: Social History   Socioeconomic History   Marital status: Married  Tobacco Use   Smoking status: Former Smoker    Packs/day: 0.00    Years:  21.00    Pack years: 0.00    Types: Cigarettes    Start date: 10/25/1957    Quit date: 10/25/1979    Years since quitting: 41.4   Smokeless tobacco: Never Used   Tobacco comment: tried--maybe smoked 1 pack total from 1959 to 1980s  Vaping Use   Vaping Use: Never used  Substance and Sexual Activity   Alcohol use: Yes    Alcohol/week: 0.0 standard drinks    Comment: 1 or 2  - 4 ounce glasses per week   Drug use: Never   Sexual activity: Not Currently    Partners: Male    Birth control/protection: Condom, Pill, None    Comment: husband only    PHYSICAL EXAM: Vitals:   04/08/21 0923  BP: 131/71  Pulse: 70   Body mass index is 27.81 kg/m. Weight: 71.2 kg (157 lb)   GENERAL: Alert, active, oriented x3  HEENT: Pupils equal reactive to light. Extraocular movements are intact. Sclera clear. Palpebral conjunctiva normal red color.Pharynx clear.  NECK: Supple with no palpable mass and no adenopathy.  LUNGS: Sound clear with no rales rhonchi or wheezes.  HEART: Regular rhythm S1 and S2 without murmur.  BREAST: breasts appear normal, no suspicious masses, no skin or nipple changes or axillary nodes.  ABDOMEN: Soft and depressible, nontender with no palpable mass, no hepatomegaly.  EXTREMITIES: Well-developed well-nourished symmetrical with no dependent edema.  NEUROLOGICAL: Awake alert oriented, facial expression symmetrical, moving  all extremities.  REVIEW OF DATA: I have reviewed the following data today: Initial consult on 04/08/2021  Component Date Value   WBC (White Blood Cell Co* 04/08/2021 8.3    RBC (Red Blood Cell Coun* 04/08/2021 4.35    Hemoglobin 04/08/2021 13.6    Hematocrit 04/08/2021 40.5    MCV (Mean Corpuscular Vo* 04/08/2021 93.1    MCH (Mean Corpuscular He* 04/08/2021 31.3 (!)   MCHC (Mean Corpuscular H* 04/08/2021 33.6    Platelet Count 04/08/2021 272    RDW-CV (Red Cell Distrib* 04/08/2021 12.5    MPV (Mean Platelet Volum* 04/08/2021 9.5    Neutrophils 04/08/2021 3.86    Lymphocytes 04/08/2021 2.99    Monocytes 04/08/2021 1.21    Eosinophils 04/08/2021 0.21    Basophils 04/08/2021 0.04    Neutrophil % 04/08/2021 46.4    Lymphocyte % 04/08/2021 35.9    Monocyte % 04/08/2021 14.5 (!)   Eosinophil % 04/08/2021 2.5    Basophil% 04/08/2021 0.5    Immature Granulocyte % 04/08/2021 0.2    Immature Granulocyte Cou* 04/08/2021 0.02   Appointment on 01/19/2021  Component Date Value   Glucose 01/19/2021 97    Sodium 01/19/2021 143    Potassium 01/19/2021 3.4 (!)   Chloride 01/19/2021 100    Carbon Dioxide (CO2) 01/19/2021 35.6 (!)   Urea Nitrogen (BUN) 01/19/2021 18    Creatinine 01/19/2021 0.8    Glomerular Filtration Ra* 01/19/2021 69    Calcium 01/19/2021 9.7    AST  01/19/2021 24    ALT  01/19/2021 24    Alk Phos (alkaline Phosp* 01/19/2021 42    Albumin 01/19/2021 4.3    Bilirubin, Total 01/19/2021 0.4    Protein, Total 01/19/2021 6.7    A/G Ratio 01/19/2021 1.8    WBC (White Blood Cell Co* 01/19/2021 6.9    RBC (Red Blood Cell Coun* 01/19/2021 4.47    Hemoglobin 01/19/2021 14.1    Hematocrit 01/19/2021 42.2    MCV (Mean Corpuscular Vo* 01/19/2021 94.4    MCH (Mean Corpuscular  He* 01/19/2021 31.5 (!)   MCHC (Mean Corpuscular H* 01/19/2021 33.4    Platelet Count 01/19/2021 307    RDW-CV (Red Cell Distrib* 01/19/2021 12.6    MPV (Mean Platelet Volum* 01/19/2021 9.5     Neutrophils 01/19/2021 2.68    Lymphocytes 01/19/2021 3.16    Monocytes 01/19/2021 0.86    Eosinophils 01/19/2021 0.13    Basophils 01/19/2021 0.05    Neutrophil % 01/19/2021 38.9    Lymphocyte % 01/19/2021 45.9    Monocyte % 01/19/2021 12.5    Eosinophil % 01/19/2021 1.9    Basophil% 01/19/2021 0.7    Immature Granulocyte % 01/19/2021 0.1    Immature Granulocyte Cou* 01/19/2021 0.01    Cholesterol, Total 01/19/2021 182    Triglyceride 01/19/2021 148    HDL (High Density Lipopr* 01/19/2021 65.7    LDL Calculated 01/19/2021 87    VLDL Cholesterol 01/19/2021 30    Cholesterol/HDL Ratio 01/19/2021 2.8    Creatinine, Random Urine 01/19/2021 52.1    Urine Albumin, Random 01/19/2021 <7    Urine Albumin/Creatinine* 01/19/2021 <13.4    Thyroid Stimulating Horm* 01/19/2021 3.297    Color 01/19/2021 Yellow    Clarity 01/19/2021 Clear    Specific Gravity 01/19/2021 1.010    pH, Urine 01/19/2021 6.0    Protein, Urinalysis 01/19/2021 Negative    Glucose, Urinalysis 01/19/2021 Negative    Ketones, Urinalysis 01/19/2021 Negative    Blood, Urinalysis 01/19/2021 Negative    Nitrite, Urinalysis 01/19/2021 Negative    Leukocyte Esterase, Urin* 01/19/2021 Negative    White Blood Cells, Urina* 01/19/2021 0-3    Red Blood Cells, Urinaly* 01/19/2021 None Seen    Bacteria, Urinalysis 01/19/2021 Rare (!)   Squamous Epithelial Cell* 01/19/2021 None Seen      ASSESSMENT: Ms. Cavalieri is a 80 y.o. female presenting for consultation for right breast atypical ductal hyperplasia.    Patient was oriented again about the pathology results. Surgical alternatives were discussed with patient including partial vs total mastectomy. Surgical technique and post operative care was discussed with patient. Risk of surgery was discussed with patient including but not limited to: wound infection, seroma, hematoma, brachial plexopathy, mondor's disease (thrombosis of small veins of breast), chronic wound pain, breast  lymphedema, altered sensation to the nipple and cosmesis among others.   Patient was oriented that atypical ductal hyperplasia and lobular neoplasia are benign findings on the proven otherwise.  They have a 10 to 20% risk of upgrading to DCIS or invasive mammary carcinoma.  They were oriented that the goal of the surgery is to rule out malignancy and not treatment for premalignant or any cancer at this moment.  I also mention the possibility of having the discussion with medical oncology for treatment with antiestrogen therapy to decrease the risk of breast cancer in the future.  Due to patient complaining of chest pain previously evaluated by cardiology she requested cardiac clearance with her cardiologist before proceeding with surgery.  Atypical ductal hyperplasia of right breast [N60.91]  PLAN: 1.  Needle guided (bracketed guidewires) excisional biopsy of the right breast (19125) 2.  CBC, CMP 3.  Cardiology clearance 4.  Hold aspirin 5 days before surgery 5.  Contact us if you have any concern.  Patient and her husband verbalized understanding, all questions were answered, and were agreeable with the plan outlined above.     Herbert Pun, MD  Electronically signed by Herbert Pun, MD

## 2021-04-09 NOTE — Progress Notes (Signed)
Received notification from [facility], that patient is aware of breast biopsy results, and requires surgical consultation.  Scheduled with Dr. Windell Moment for 04/08/21.  Per Dr. Windell Moment, patient may need Medical Oncology referral tn the future to discuss the role of anti hormonal.  Offered to assist with this when needed.

## 2021-04-16 ENCOUNTER — Other Ambulatory Visit: Payer: Self-pay | Admitting: Dermatology

## 2021-04-30 ENCOUNTER — Other Ambulatory Visit: Payer: Self-pay | Admitting: General Surgery

## 2021-04-30 DIAGNOSIS — N6091 Unspecified benign mammary dysplasia of right breast: Secondary | ICD-10-CM

## 2021-05-01 IMAGING — RF DG SWALLOWING FUNCTION
1 series · 1 of 1 positions shown · non-contrast
Comparison: None.

CLINICAL DATA: Dysphagia.

EXAM:
MODIFIED BARIUM SWALLOW
TECHNIQUE: Different consistencies of barium were administered orally to the
patient by the Speech Pathologist. Imaging of the pharynx was
performed in the lateral projection. The radiologist was present in
the fluoroscopy room for this study, providing personal supervision.
FLUOROSCOPY TIME:  Fluoroscopy Time:  36 seconds
Radiation Exposure Index (if provided by the fluoroscopic device):
1.0 mGy
Number of Acquired Spot Images: 0 full exposures

[Series 1: cp_standard · 0.17mm/px · 1 of 1 slices shown]
[im 1/1]
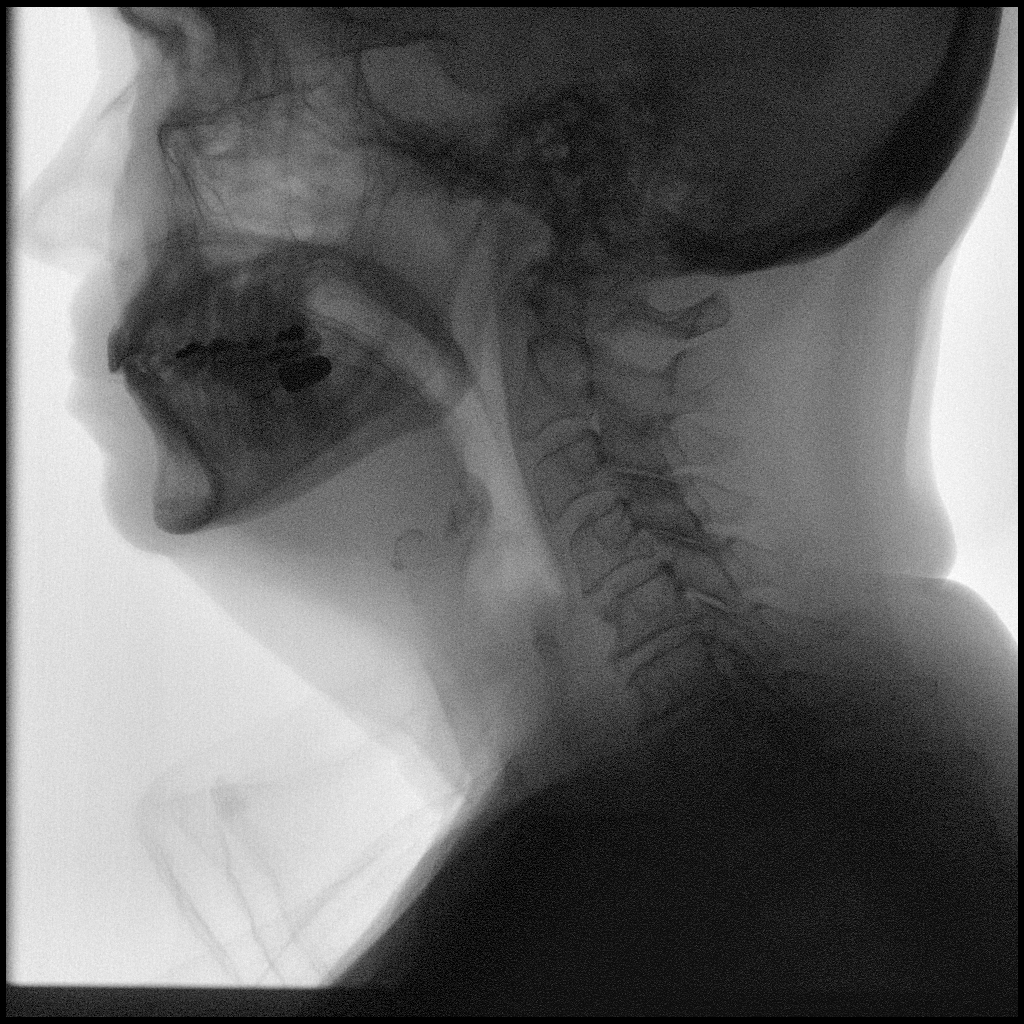

[1 of 1 positions shown; findings below may reference images not displayed]

FINDINGS: No aspiration or penetration was visualized.
IMPRESSION: No aspiration or penetration was visualized.

Please refer to the Speech Pathologists report for complete details
and recommendations.

## 2021-05-04 ENCOUNTER — Ambulatory Visit (INDEPENDENT_AMBULATORY_CARE_PROVIDER_SITE_OTHER): Payer: Medicare Other | Admitting: Dermatology

## 2021-05-04 ENCOUNTER — Other Ambulatory Visit: Payer: Self-pay

## 2021-05-04 DIAGNOSIS — L820 Inflamed seborrheic keratosis: Secondary | ICD-10-CM

## 2021-05-04 DIAGNOSIS — Z85828 Personal history of other malignant neoplasm of skin: Secondary | ICD-10-CM | POA: Diagnosis not present

## 2021-05-04 DIAGNOSIS — L814 Other melanin hyperpigmentation: Secondary | ICD-10-CM

## 2021-05-04 DIAGNOSIS — D225 Melanocytic nevi of trunk: Secondary | ICD-10-CM

## 2021-05-04 DIAGNOSIS — D1800 Hemangioma unspecified site: Secondary | ICD-10-CM

## 2021-05-04 DIAGNOSIS — L719 Rosacea, unspecified: Secondary | ICD-10-CM

## 2021-05-04 DIAGNOSIS — L219 Seborrheic dermatitis, unspecified: Secondary | ICD-10-CM

## 2021-05-04 DIAGNOSIS — L821 Other seborrheic keratosis: Secondary | ICD-10-CM

## 2021-05-04 DIAGNOSIS — L738 Other specified follicular disorders: Secondary | ICD-10-CM

## 2021-05-04 DIAGNOSIS — Z86018 Personal history of other benign neoplasm: Secondary | ICD-10-CM

## 2021-05-04 DIAGNOSIS — D229 Melanocytic nevi, unspecified: Secondary | ICD-10-CM

## 2021-05-04 DIAGNOSIS — D2272 Melanocytic nevi of left lower limb, including hip: Secondary | ICD-10-CM

## 2021-05-04 DIAGNOSIS — L578 Other skin changes due to chronic exposure to nonionizing radiation: Secondary | ICD-10-CM

## 2021-05-04 DIAGNOSIS — Z1283 Encounter for screening for malignant neoplasm of skin: Secondary | ICD-10-CM | POA: Diagnosis not present

## 2021-05-04 DIAGNOSIS — L720 Epidermal cyst: Secondary | ICD-10-CM

## 2021-05-04 MED ORDER — CICLOPIROX 1 % EX SHAM: MEDICATED_SHAMPOO | CUTANEOUS | 6 refills | Status: DC

## 2021-05-04 MED ORDER — CLOBETASOL PROPIONATE 0.05 % EX SOLN: CUTANEOUS | 3 refills | Status: DC

## 2021-05-04 MED ORDER — METRONIDAZOLE 0.75 % EX CREA: TOPICAL_CREAM | CUTANEOUS | 6 refills | Status: DC

## 2021-05-04 NOTE — Progress Notes (Signed)
Follow-Up Visit   Subjective  Kristen Singh is a 80 y.o. female who presents for the following: Annual Exam (Hx BCC, dysplastic nevus). Patient c/o occasional itching on her back, recurrence of rosacea, and scalp issues which improved with increasing Ciclopirox shampoo.   The following portions of the chart were reviewed this encounter and updated as appropriate:       Review of Systems:  No other skin or systemic complaints except as noted in HPI or Assessment and Plan.  Objective  Well appearing patient in no apparent distress; mood and affect are within normal limits.  A full examination was performed including scalp, head, eyes, ears, nose, lips, neck, chest, axillae, abdomen, back, buttocks, bilateral upper extremities, bilateral lower extremities, hands, feet, fingers, toes, fingernails, and toenails. All findings within normal limits unless otherwise noted below.  Scalp Clear today  Back Erythematous keratotic or waxy stuck-on papule or plaque.   R inf breast med 7 x 5 mm waxy tan papule, darker edge.  R inf breast lat 1.3 x 0.9 cm pink brown flat papule.  L inf hip 0.2 cm med brown macule.  R upper eyelid margin Tiny white papule.  Face Mild erythema on the forehead and nose with telangiectasias.   Assessment & Plan  Seborrheic dermatitis Scalp  Seborrheic Dermatitis- controlled with treatment  -  is a chronic persistent rash characterized by pinkness and scaling most commonly of the mid face but also can occur on the scalp (dandruff), ears; mid chest and mid back. It tends to be exacerbated by stress and cooler weather.  People who have neurologic disease may experience new onset or exacerbation of existing seborrheic dermatitis.  The condition is not curable but treatable and can be controlled.  Continue Ciclopirox shampoo 1% 2-3 times per week Continue Clobetasol solution QD-BID PRN flares.  Continue Ketoconazole 2% cream QD-BID on the face PRN  flares.  Topical steroids (such as triamcinolone, fluocinolone, fluocinonide, mometasone, clobetasol, halobetasol, betamethasone, hydrocortisone) can cause thinning and lightening of the skin if they are used for too long in the same area. Your physician has selected the right strength medicine for your problem and area affected on the body. Please use your medication only as directed by your physician to prevent side effects.    Ciclopirox 1 % shampoo - Scalp Shampoo into scalp let sit 10 minutes then wash out. Use 2-3 days per week.  Related Medications clobetasol (TEMOVATE) 0.05 % external solution APPLY TO SKIN AS DIRECTED, APPLY TO AFFECTED AREA OF SCALP EACH DAY NOT SHAMPOOING SCALP  Inflamed seborrheic keratosis Back  Itching around braline on back  Will defer cryotherapy treatment until after patient's upcoming breast surgery  Seborrheic keratosis R inf breast med  Reassured benign age-related growth.  Stable. Recommend observation.  Discussed cryotherapy if spot(s) become irritated or inflamed.  Nevus (2) R inf breast lat; L inf hip  Benign-appearing.  Observation.  Call clinic for new or changing moles.  Recommend daily use of broad spectrum spf 30+ sunscreen to sun-exposed areas.   Milia R upper eyelid margin  Benign, observe.  Recommend following up with eye doctor for removal if bothersome.   Rosacea Face  Rosacea is a chronic progressive skin condition usually affecting the face of adults, causing redness and/or acne bumps. It is treatable but not curable. It sometimes affects the eyes (ocular rosacea) as well. It may respond to topical and/or systemic medication and can flare with stress, sun exposure, alcohol, exercise and some foods.  Daily application of broad spectrum spf 30+ sunscreen to face is recommended to reduce flares.  Start Metronidazole 0.75% cream qd/bid.   metroNIDAZOLE (METROCREAM) 0.75 % cream - Face For rosacea apply a thin coat to the face  QHS.  Lentigines - Scattered tan macules - Due to sun exposure - Benign-appering, observe - Recommend daily broad spectrum sunscreen SPF 30+ to sun-exposed areas, reapply every 2 hours as needed. - Call for any changes  Seborrheic Keratoses - Stuck-on, waxy, tan-brown papules and/or plaques  - Benign-appearing - Discussed benign etiology and prognosis. - Observe - Call for any changes  Melanocytic Nevi - Tan-brown and/or pink-flesh-colored symmetric macules and papules - Benign appearing on exam today - Observation - Call clinic for new or changing moles - Recommend daily use of broad spectrum spf 30+ sunscreen to sun-exposed areas.   Hemangiomas - Red papules - Discussed benign nature - Observe - Call for any changes  Actinic Damage - Chronic condition, secondary to cumulative UV/sun exposure - diffuse scaly erythematous macules with underlying dyspigmentation - Recommend daily broad spectrum sunscreen SPF 30+ to sun-exposed areas, reapply every 2 hours as needed.  - Staying in the shade or wearing long sleeves, sun glasses (UVA+UVB protection) and wide brim hats (4-inch brim around the entire circumference of the hat) are also recommended for sun protection.  - Call for new or changing lesions.  History of Basal Cell Carcinoma of the Skin - L lower pretibial - No evidence of recurrence today - Recommend regular full body skin exams - Recommend daily broad spectrum sunscreen SPF 30+ to sun-exposed areas, reapply every 2 hours as needed.  - Call if any new or changing lesions are noted between office visits  History of Dysplastic Nevus - R med shoulder, mild  - No evidence of recurrence today - Recommend regular full body skin exams - Recommend daily broad spectrum sunscreen SPF 30+ to sun-exposed areas, reapply every 2 hours as needed.  - Call if any new or changing lesions are noted between office visits  Sebaceous Hyperplasia - Small yellow papules with a central  dell - Benign - Observe  Skin cancer screening performed today.  Return in about 1 year (around 05/04/2022) for TBSE.  Kristen Singh, CMA, am acting as scribe for Brendolyn Patty, MD .  Documentation: I have reviewed the above documentation for accuracy and completeness, and I agree with the above.  Brendolyn Patty MD

## 2021-05-04 NOTE — Patient Instructions (Signed)

## 2021-05-05 ENCOUNTER — Other Ambulatory Visit: Payer: Self-pay

## 2021-05-05 ENCOUNTER — Other Ambulatory Visit
Admission: RE | Admit: 2021-05-05 | Discharge: 2021-05-05 | Disposition: A | Discharge status: Home / Self Care | Payer: Medicare Other | Source: Ambulatory Visit | Attending: General Surgery | Admitting: General Surgery

## 2021-05-05 HISTORY — DX: Essential (primary) hypertension: I10

## 2021-05-05 HISTORY — DX: Gastro-esophageal reflux disease without esophagitis: K21.9

## 2021-05-05 HISTORY — DX: Personal history of urinary calculi: Z87.442

## 2021-05-05 HISTORY — DX: Anxiety disorder, unspecified: F41.9

## 2021-05-05 HISTORY — DX: Unspecified osteoarthritis, unspecified site: M19.90

## 2021-05-05 HISTORY — DX: Headache, unspecified: R51.9

## 2021-05-05 HISTORY — DX: Depression, unspecified: F32.A

## 2021-05-05 NOTE — Patient Instructions (Addendum)
Your procedure is scheduled on: 05/08/21 - Friday Report to the Registration Desk on the 1st floor of the [facility]. To find out your arrival time, please call [phone] between 1PM - 3PM on: 05/07/21 - Thursday  REMEMBER: Instructions that are not followed completely may result in serious medical risk, up to and including death; or upon the discretion of your surgeon and anesthesiologist your surgery may need to be rescheduled.  Do not eat food or drink any fluids after midnight the night before surgery.  No gum chewing, lozengers or hard candies.   TAKE THESE MEDICATIONS THE MORNING OF SURGERY WITH A SIP OF WATER:  - DULoxetine (CYMBALTA) 60 MG capsule - gabapentin (NEURONTIN) 800 MG tablet - metoprolol tartrate (LOPRESSOR) 50 MG tablet - omeprazole (PRILOSEC) 20 MG capsule - sertraline (ZOLOFT) 50 MG tablet - Cetirizine HCl 10 MG CAPS - ipratropium (ATROVENT) 0.03 % nasal spray  Follow recommendations from Cardiologist, Pulmonologist or PCP regarding stopping Aspirin, Coumadin, Plavix, Eliquis, Pradaxa, or Pletal. Aspirin 81 mg was stopped on 04/30/21.  One week prior to surgery: Stop Anti-inflammatories (NSAIDS) such as Advil, Aleve, Ibuprofen, Motrin, Naproxen, Naprosyn and Aspirin based products such as Excedrin, Goodys Powder, BC Powder.  Stop ANY OVER THE COUNTER supplements until after surgery.  You may however, continue to take Tylenol if needed for pain up until the day of surgery.  No Alcohol for 24 hours before or after surgery.  No Smoking including e-cigarettes for 24 hours prior to surgery.  No chewable tobacco products for at least 6 hours prior to surgery.  No nicotine patches on the day of surgery.  Do not use any "recreational" drugs for at least a week prior to your surgery.  Please be advised that the combination of cocaine and anesthesia may have negative outcomes, up to and including death. If you test positive for cocaine, your surgery will be  cancelled.  On the morning of surgery brush your teeth with toothpaste and water, you may rinse your mouth with mouthwash if you wish. Do not swallow any toothpaste or mouthwash.  Do not wear jewelry, make-up, hairpins, clips or nail polish.  Do not wear lotions, powders, or perfumes.   Do not shave body from the neck down 48 hours prior to surgery just in case you cut yourself which could leave a site for infection.  Also, freshly shaved skin may become irritated if using the CHG soap.  Contact lenses, hearing aids and dentures may not be worn into surgery.  Do not bring valuables to the hospital. [facility] is not responsible for any missing/lost belongings or valuables.   Notify your doctor if there is any change in your medical condition (cold, fever, infection).  Wear comfortable clothing (specific to your surgery type) to the hospital.  After surgery, you can help prevent lung complications by doing breathing exercises.  Take deep breaths and cough every 1-2 hours. Your doctor may order a device called an Incentive Spirometer to help you take deep breaths. When coughing or sneezing, hold a pillow firmly against your incision with both hands. This is called "splinting." Doing this helps protect your incision. It also decreases belly discomfort.  If you are being admitted to the hospital overnight, leave your suitcase in the car. After surgery it may be brought to your room.  If you are being discharged the day of surgery, you will not be allowed to drive home. You will need a responsible adult (18 years or older) to drive  you home and stay with you that night.   If you are taking public transportation, you will need to have a responsible adult (18 years or older) with you. Please confirm with your physician that it is acceptable to use public transportation.   Please call the [facility] at [phone] if you have any questions about these  instructions.  Surgery Visitation Policy:  Patients undergoing a surgery or procedure may have one family member or support person with them as long as that person is not COVID-19 positive or experiencing its symptoms.  That person may remain in the waiting area during the procedure.  Inpatient Visitation:    Visiting hours are 7 a.m. to 8 p.m. Inpatients will be allowed two visitors daily. The visitors may change each day during the patient's stay. No visitors under the age of 30. Any visitor under the age of 70 must be accompanied by an adult. The visitor must pass COVID-19 screenings, use hand sanitizer when entering and exiting the patient's room and wear a mask at all times, including in the patient's room. Patients must also wear a mask when staff or their visitor are in the room. Masking is required regardless of vaccination status.

## 2021-05-06 ENCOUNTER — Encounter: Payer: Self-pay | Admitting: General Surgery

## 2021-05-06 NOTE — Progress Notes (Signed)
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 Clinical nursing staff has updated patient's PMH/PSHx, current medication list, and drug allergies/intolerances to ensure comprehensive history available to assist in medical decision making as it pertains to the aforementioned surgical procedure and anticipated anesthetic course. Extensive review of available clinical information performed. [name] and PSHx updated with any diagnoses/procedures that  may have been inadvertently omitted during her intake with the pre-admission testing department's nursing staff.  Clinical Discussion:  Kristen Singh is a 80 y.o. female who is submitted for pre-surgical anesthesia review and clearance prior to her undergoing the above procedure. Patient has never been a smoker. Pertinent PMH includes: DOE, HTN, HLD, GERD (on daily PPI; uses CaCO3 tablets PRN), RIGHT breast mass (Bx (+) for ADH), OA, cervical and lumbar DDD, anxiety (on BZO), and depression.  Patient is followed by cardiology Kristen Massed, MD). She was last seen in the cardiology clinic on 04/29/2021; notes reviewed.  At the time of her clinic visit, patient doing well overall from a cardiovascular perspective.  She denied any chest pain, shortness breath, PND, orthopnea, palpitations, significant peripheral edema, or presyncope/syncope.  Patient did endorse some vertiginous symptoms associated  with position changes.  Patient had recently undergone stress testing for evaluation of her increasing exertional dyspnea.  Lexiscan performed on 04/23/2021 revealed an LVEF of 62% with no evidence of stress-induced myocardial ischemia or arrhythmia.  In review of her past medical records, it is noted that patient underwent stress echocardiogram on 11/01/2016 that revealed inferolateral ischemia (see full interpretation of cardiovascular testing below). Blood pressure well controlled at 120/80 on currently prescribed beta-blocker monotherapy.  Patient takes a statin for her HLD. Functional capacity, as defined by DASI, is documented as being >/= 4 METS.  No changes were made to patient's medication regimen.  Patient to follow-up with outpatient cardiology in 6 months or sooner if needed.  Patient is scheduled for a excision of a RIGHT breast mass on 05/08/2021 with Dr. Herbert Pun, MD.  Given patient's past medical history significant for cardiovascular diagnoses, presurgical cardiac clearance was sought by the performing surgeon's office and PAT team. "The patient is at the lowest risk possible for perioperative cardiovascular complications with the planned procedure.  The overall risk her procedure is low (<1%).  Currently has no evidence active and/or significant angina and/or congestive heart failure. Patient may proceed to surgery without restriction or need for further cardiovascular testing and an overall LOW risk". This patient is on daily antiplatelet therapy. She has been instructed on recommendations for holding her daily low-dose ASA for 7 days prior to her procedure with plans to restart as soon as postoperative bleeding risk felt to be minimized by her attending surgeon. The patient has been instructed that her last dose of her anticoagulant will be on 04/30/2021.  Patient denies previous perioperative complications with anesthesia in the past. In review of her EMR, there are no records  available for review regarding patient's past  procedural/anesthetic courses within the [facility] system.  No flowsheet data found.  Providers/Specialists:   NOTE: Primary physician provider listed below. Patient may have been seen by APP or partner within same practice.   PROVIDER ROLE / SPECIALTY LAST Kristen Savoy, MD  General Surgery 04/08/2021   Kristen Hector, MD  Primary Care Provider 01/26/2021   Kristen Royals, MD  Cardiology 04/29/2021   Allergies:  Codeine  Current Home Medications:   No current facility-administered medications for this encounter.    acetaminophen (TYLENOL) 160 mg/5 mL SOLN   alendronate (FOSAMAX) 70 MG tablet   ALPRAZolam (XANAX) 0.25 MG tablet   ARIPiprazole (ABILIFY) 2 MG tablet   aspirin 81 MG chewable tablet   calcium carbonate (TUMS EX) 750 MG chewable tablet   Calcium-Phosphorus-Vitamin D (CITRACAL +D3 PO)   Cetirizine HCl 10 MG CAPS   Ciclopirox 1 % shampoo   Ciclopirox 1 % shampoo   clobetasol (TEMOVATE) 0.05 % external solution   DULoxetine (CYMBALTA) 60 MG capsule   folic acid (FOLVITE) 923 MCG tablet   gabapentin (NEURONTIN) 800 MG tablet   ipratropium (ATROVENT) 0.03 % nasal spray   metoprolol tartrate (LOPRESSOR) 50 MG tablet   metroNIDAZOLE (METROCREAM) 0.75 % cream   omeprazole (PRILOSEC) 20 MG capsule   pravastatin (PRAVACHOL) 40 MG tablet   sertraline (ZOLOFT) 50 MG tablet   History:   Past Medical History:  Diagnosis Date   Anxiety    Arthritis    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   Atypical mole 03/29/2017   right medial shoulder/mild   Basal cell carcinoma 04/10/2019   left lower pretibial   Carpal tunnel syndrome    Cortical cataract    DDD (degenerative disc disease), cervical    DDD (degenerative disc disease), lumbar    Depression    DOE (dyspnea on exertion)    GERD (gastroesophageal reflux disease)    Headache    History of kidney stones    HLD (hyperlipidemia)     Hypertension    Migraines    Osteopenia    Past Surgical History:  Procedure Laterality Date   BREAST BIOPSY Right 02/21/2020   affirm bx calcs, x marker, mild stromal fibrosis, benign, no atypia   BREAST BIOPSY Right 04/06/2021   right breast UIQ calcs, coil marker --> path showed COLUMNAR CELL CHANGE, FIBROADENOMATOID CHANGES, SCLEROSING ADENOSIS, LOBULAR NEOPLASIA   BREAST BIOPSY Right 04/06/2021   UIQ calcs, ribbon marker --> path showed FOCAL ATYPICAL DUCTAL HYPERPLASIA (ADH) IN A BACKGROUND OF COLUMNAR CELL CHANGE WITH USUAL DUCTAL HYPERPLASIA, APOCRINE METAPLASIA, SCLEROSING ADENOSIS, LOBULAR NEOPLASIA   CATARACT EXTRACTION, BILATERAL N/A    CHOLECYSTECTOMY     COLONOSCOPY     TONSILLECTOMY     WISDOM TOOTH EXTRACTION     Family History  Problem Relation Age of Onset   Breast cancer Sister 67   Breast cancer Maternal Grandmother        70 or 4   Breast cancer Sister 21   Social History   Tobacco Use   Smoking status: Never    Passive exposure: Never   Smokeless tobacco: Never  Substance Use Topics   Alcohol use: Not Currently   Drug use: Never    Pertinent Clinical Results:  LABS: Labs reviewed: Acceptable for surgery.   Ref Range & Units 4 wk ago  WBC (White Blood Cell Count) 4.1 - 10.2 10^3/uL 8.3   RBC (Red Blood Cell Count) 4.04 - 5.48 10^6/uL  4.35   Hemoglobin 12.0 - 15.0 gm/dL 13.6   Hematocrit 35.0 - 47.0 % 40.5   MCV (Mean Corpuscular Volume) 80.0 - 100.0 fl 93.1   MCH (Mean Corpuscular Hemoglobin) 27.0 - 31.2 pg 31.3 High    MCHC (Mean Corpuscular Hemoglobin Concentration) 32.0 - 36.0 gm/dL 33.6   Platelet Count 150 - 450 10^3/uL 272   RDW-CV (Red Cell Distribution Width) 11.6 - 14.8 % 12.5   MPV (Mean Platelet Volume) 9.4 - 12.4 fl 9.5   Neutrophils 1.50 - 7.80 10^3/uL 3.86   Lymphocytes 1.00 - 3.60 10^3/uL 2.99   Monocytes 0.00 - 1.50 10^3/uL 1.21   Eosinophils 0.00 - 0.55 10^3/uL 0.21   Basophils 0.00 - 0.09 10^3/uL 0.04   Neutrophil %  32.0 - 70.0 % 46.4   Lymphocyte % 10.0 - 50.0 % 35.9   Monocyte % 4.0 - 13.0 % 14.5 High    Eosinophil % 1.0 - 5.0 % 2.5   Basophil% 0.0 - 2.0 % 0.5   Immature Granulocyte % <=0.7 % 0.2   Immature Granulocyte Count <=0.06 10^3/L 0.02   Resulting Agency  [facility] - [facility]  Specimen Collected: 04/08/21 10:46 Last Resulted: 04/08/21 11:06  Received From: [facility]  Result Received: 04/16/21 12:43    Ref Range & Units 4 wk ago  Glucose 70 - 110 mg/dL 95   Sodium 136 - 145 mmol/L 139   Potassium 3.6 - 5.1 mmol/L 4.0   Chloride 97 - 109 mmol/L 95 Low    Carbon Dioxide (CO2) 22.0 - 32.0 mmol/L 37.5 High    Urea Nitrogen (BUN) 7 - 25 mg/dL 14   Creatinine 0.6 - 1.1 mg/dL 0.8   Glomerular Filtration Rate (eGFR), MDRD Estimate >60 mL/min/1.73sq m 69   Calcium 8.7 - 10.3 mg/dL 10.1   AST  8 - 39 U/L 27   ALT  5 - 38 U/L 23   Alk Phos (alkaline Phosphatase) 34 - 104 U/L 49   Albumin 3.5 - 4.8 g/dL 4.4   Bilirubin, Total 0.3 - 1.2 mg/dL 0.6   Protein, Total 6.1 - 7.9 g/dL 6.9   A/G Ratio 1.0 - 5.0 gm/dL 1.8   Resulting Agency  [facility] - [facility]  Specimen Collected: 04/08/21 10:46 Last Resulted: 04/08/21 14:41  Received From: [facility]  Result Received: 04/16/21 12:43    ECG: Date: 04/20/2021 Rate: 65 bpm Rhythm: normal sinus Axis (leads I and aVF): Normal Intervals: PR 164 ms. QRS 72 ms. QTc 470 ms. ST segment and T wave changes: No evidence of acute ST segment elevation or depression Comparison: Similar to previous tracing obtained on 03/30/2017 NOTE: Tracing obtained at [facility]; unable for review. Above based on cardiologist's interpretation.   IMAGING / PROCEDURES: LEXISCAN performed on 04/23/2021 LVEF 62% Regional wall motion reveals normal myocardial thickening and wall motion No artifacts noted Left ventricular cavity size normal No evidence of stress-induced myocardial ischemia or arrhythmia The overall  quality of the study is good  STRESS ECHOCARDIOGRAM performed on 11/01/2016 LVEF >55% Normal right ventricular systolic function Mild AR and TR Trivial MR and PR No evidence of valvular stenosis Inferolateral ischemia noted on stress imaging Abnormal stress echocardiogram  Impression and Plan:  Kristen Singh has been referred for pre-anesthesia review and clearance prior to her undergoing the planned anesthetic and procedural courses. Available labs, pertinent testing, and imaging results were personally reviewed by me. This patient has been appropriately cleared by cardiology with  an overall LOW risk of significant perioperative cardiovascular complications.  Based on clinical review performed today (05/06/21), barring any significant acute changes in the patient's overall condition, it is anticipated that she will be able to proceed with the planned surgical intervention. Any acute changes in clinical condition may necessitate her procedure being postponed and/or cancelled. Patient will meet with anesthesia team (MD and/or CRNA) on the day of her procedure for preoperative evaluation/assessment. Questions regarding anesthetic course will be fielded at that time.   Pre-surgical instructions were reviewed with the patient during her PAT appointment and questions were fielded by PAT clinical staff. Patient was advised that if any questions or concerns arise prior to her procedure then she should return a call to PAT and/or her surgeon's office to discuss.  Honor Loh, MSN, APRN, FNP-C, CEN [facility]  Peri-operative Services Nurse Practitioner Phone: [phone] Fax: [phone] 05/06/21 9:44 AM  NOTE: This note has been prepared using Dragon dictation software. Despite my best ability to proofread, there is always the potential that unintentional transcriptional errors may still occur from this process.

## 2021-05-08 ENCOUNTER — Encounter
Admission: RE | Disposition: A | Discharge status: Home / Self Care | Payer: Self-pay | Source: Home / Self Care | Attending: General Surgery

## 2021-05-08 ENCOUNTER — Ambulatory Visit
Admission: RE | Admit: 2021-05-08 | Discharge: 2021-05-08 | Disposition: A | Discharge status: Home / Self Care | Payer: Medicare Other | Attending: General Surgery | Admitting: General Surgery

## 2021-05-08 ENCOUNTER — Ambulatory Visit
Admission: RE | Admit: 2021-05-08 | Discharge: 2021-05-08 | Disposition: A | Discharge status: Home / Self Care | Payer: Medicare Other | Source: Ambulatory Visit | Attending: General Surgery | Admitting: General Surgery

## 2021-05-08 ENCOUNTER — Ambulatory Visit: Payer: Medicare Other | Admitting: Urgent Care

## 2021-05-08 ENCOUNTER — Other Ambulatory Visit: Payer: Self-pay

## 2021-05-08 DIAGNOSIS — K219 Gastro-esophageal reflux disease without esophagitis: Secondary | ICD-10-CM | POA: Diagnosis not present

## 2021-05-08 DIAGNOSIS — E7800 Pure hypercholesterolemia, unspecified: Secondary | ICD-10-CM | POA: Diagnosis not present

## 2021-05-08 DIAGNOSIS — Z803 Family history of malignant neoplasm of breast: Secondary | ICD-10-CM | POA: Diagnosis not present

## 2021-05-08 DIAGNOSIS — I10 Essential (primary) hypertension: Secondary | ICD-10-CM | POA: Insufficient documentation

## 2021-05-08 DIAGNOSIS — M1990 Unspecified osteoarthritis, unspecified site: Secondary | ICD-10-CM | POA: Insufficient documentation

## 2021-05-08 DIAGNOSIS — N6091 Unspecified benign mammary dysplasia of right breast: Secondary | ICD-10-CM

## 2021-05-08 DIAGNOSIS — Z87891 Personal history of nicotine dependence: Secondary | ICD-10-CM | POA: Insufficient documentation

## 2021-05-08 DIAGNOSIS — Z7982 Long term (current) use of aspirin: Secondary | ICD-10-CM | POA: Insufficient documentation

## 2021-05-08 DIAGNOSIS — D0591 Unspecified type of carcinoma in situ of right breast: Secondary | ICD-10-CM | POA: Diagnosis not present

## 2021-05-08 DIAGNOSIS — Z885 Allergy status to narcotic agent status: Secondary | ICD-10-CM | POA: Diagnosis not present

## 2021-05-08 DIAGNOSIS — Z7983 Long term (current) use of bisphosphonates: Secondary | ICD-10-CM | POA: Insufficient documentation

## 2021-05-08 DIAGNOSIS — Z79899 Other long term (current) drug therapy: Secondary | ICD-10-CM | POA: Insufficient documentation

## 2021-05-08 DIAGNOSIS — N6021 Fibroadenosis of right breast: Secondary | ICD-10-CM | POA: Insufficient documentation

## 2021-05-08 HISTORY — DX: Other specified disorders of bone density and structure, unspecified site: M85.80

## 2021-05-08 HISTORY — DX: Carpal tunnel syndrome, unspecified upper limb: G56.00

## 2021-05-08 HISTORY — DX: Unspecified cataract: H26.9

## 2021-05-08 HISTORY — DX: Dyspnea, unspecified: R06.00

## 2021-05-08 HISTORY — DX: Other cervical disc degeneration, unspecified cervical region: M50.30

## 2021-05-08 HISTORY — DX: Migraine, unspecified, not intractable, without status migrainosus: G43.909

## 2021-05-08 HISTORY — DX: Other forms of dyspnea: R06.09

## 2021-05-08 HISTORY — PX: EXCISION OF BREAST BIOPSY: SHX5822

## 2021-05-08 HISTORY — DX: Hyperlipidemia, unspecified: E78.5

## 2021-05-08 HISTORY — DX: Other intervertebral disc degeneration, lumbar region: M51.36

## 2021-05-08 SURGERY — EXCISION OF BREAST BIOPSY
Anesthesia: General | Site: Breast | Laterality: Right

## 2021-05-08 MED ORDER — LACTATED RINGERS IV SOLN: INTRAVENOUS | Status: DC

## 2021-05-08 MED ORDER — ACETAMINOPHEN 10 MG/ML IV SOLN
INTRAVENOUS | Status: AC
  Filled 2021-05-08: qty 100

## 2021-05-08 MED ORDER — FENTANYL CITRATE (PF) 100 MCG/2ML IJ SOLN
INTRAMUSCULAR | Status: DC | PRN
  Administered 2021-05-08: 12.5 ug via INTRAVENOUS
  Administered 2021-05-08: 25 ug via INTRAVENOUS

## 2021-05-08 MED ORDER — LIDOCAINE HCL (PF) 2 % IJ SOLN
INTRAMUSCULAR | Status: AC
  Filled 2021-05-08: qty 5

## 2021-05-08 MED ORDER — CEFAZOLIN SODIUM-DEXTROSE 2-4 GM/100ML-% IV SOLN
2.0000 g | INTRAVENOUS | Status: AC
  Administered 2021-05-08: 2 g via INTRAVENOUS

## 2021-05-08 MED ORDER — PHENYLEPHRINE HCL (PRESSORS) 10 MG/ML IV SOLN: INTRAVENOUS | Status: DC | PRN

## 2021-05-08 MED ORDER — DEXAMETHASONE SODIUM PHOSPHATE 10 MG/ML IJ SOLN
INTRAMUSCULAR | Status: DC | PRN
  Administered 2021-05-08: 5 mg via INTRAVENOUS

## 2021-05-08 MED ORDER — EPHEDRINE SULFATE 50 MG/ML IJ SOLN
INTRAMUSCULAR | Status: DC | PRN
  Administered 2021-05-08 (×2): 10 mg via INTRAVENOUS
  Administered 2021-05-08: 5 mg via INTRAVENOUS

## 2021-05-08 MED ORDER — DEXAMETHASONE SODIUM PHOSPHATE 10 MG/ML IJ SOLN
INTRAMUSCULAR | Status: AC
  Filled 2021-05-08: qty 1

## 2021-05-08 MED ORDER — CEFAZOLIN SODIUM-DEXTROSE 2-4 GM/100ML-% IV SOLN
INTRAVENOUS | Status: AC
  Filled 2021-05-08: qty 100

## 2021-05-08 MED ORDER — ONDANSETRON HCL 4 MG/2ML IJ SOLN
INTRAMUSCULAR | Status: AC
  Filled 2021-05-08: qty 2

## 2021-05-08 MED ORDER — BUPIVACAINE-EPINEPHRINE 0.5% -1:200000 IJ SOLN
INTRAMUSCULAR | Status: DC | PRN
  Administered 2021-05-08: 30 mL

## 2021-05-08 MED ORDER — ACETAMINOPHEN 10 MG/ML IV SOLN
INTRAVENOUS | Status: DC | PRN
  Administered 2021-05-08: 1000 mg via INTRAVENOUS

## 2021-05-08 MED ORDER — MEPERIDINE HCL 25 MG/ML IJ SOLN: 6.2500 mg | INTRAMUSCULAR | Status: DC | PRN

## 2021-05-08 MED ORDER — FENTANYL CITRATE (PF) 100 MCG/2ML IJ SOLN
INTRAMUSCULAR | Status: AC
  Filled 2021-05-08: qty 2

## 2021-05-08 MED ORDER — CHLORHEXIDINE GLUCONATE 0.12 % MT SOLN
15.0000 mL | Freq: Once | OROMUCOSAL | Status: AC
  Administered 2021-05-08: 15 mL via OROMUCOSAL

## 2021-05-08 MED ORDER — 0.9 % SODIUM CHLORIDE (POUR BTL) OPTIME
TOPICAL | Status: DC | PRN
  Administered 2021-05-08: 500 mL

## 2021-05-08 MED ORDER — STERILE WATER FOR IRRIGATION IR SOLN
Status: DC | PRN
  Administered 2021-05-08: 1000 mL

## 2021-05-08 MED ORDER — BUPIVACAINE-EPINEPHRINE (PF) 0.5% -1:200000 IJ SOLN
INTRAMUSCULAR | Status: AC
  Filled 2021-05-08: qty 30

## 2021-05-08 MED ORDER — ONDANSETRON HCL 4 MG/2ML IJ SOLN
INTRAMUSCULAR | Status: DC | PRN
  Administered 2021-05-08: 4 mg via INTRAVENOUS

## 2021-05-08 MED ORDER — FENTANYL CITRATE (PF) 100 MCG/2ML IJ SOLN: 25.0000 ug | INTRAMUSCULAR | Status: DC | PRN

## 2021-05-08 MED ORDER — ONDANSETRON HCL 4 MG/2ML IJ SOLN: 4.0000 mg | Freq: Once | INTRAMUSCULAR | Status: DC | PRN

## 2021-05-08 MED ORDER — EPHEDRINE 5 MG/ML INJ
INTRAVENOUS | Status: AC
  Filled 2021-05-08: qty 10

## 2021-05-08 MED ORDER — TRAMADOL HCL 50 MG PO TABS: 50.0000 mg | ORAL_TABLET | Freq: Four times a day (QID) | ORAL | 0 refills | Status: AC | PRN

## 2021-05-08 MED ORDER — ORAL CARE MOUTH RINSE: 15.0000 mL | Freq: Once | OROMUCOSAL | Status: AC

## 2021-05-08 MED ORDER — CHLORHEXIDINE GLUCONATE 0.12 % MT SOLN
OROMUCOSAL | Status: AC
  Filled 2021-05-08: qty 15

## 2021-05-08 MED ORDER — LIDOCAINE HCL (CARDIAC) PF 100 MG/5ML IV SOSY
PREFILLED_SYRINGE | INTRAVENOUS | Status: DC | PRN
  Administered 2021-05-08: 60 mg via INTRAVENOUS

## 2021-05-08 MED ORDER — PROPOFOL 10 MG/ML IV BOLUS
INTRAVENOUS | Status: AC
  Filled 2021-05-08: qty 20

## 2021-05-08 MED ORDER — PROPOFOL 10 MG/ML IV BOLUS
INTRAVENOUS | Status: DC | PRN
  Administered 2021-05-08: 100 mg via INTRAVENOUS

## 2021-05-08 SURGICAL SUPPLY — 44 items
ADH SKN CLS APL DERMABOND .7 (GAUZE/BANDAGES/DRESSINGS) ×1
APL PRP STRL LF DISP 70% ISPRP (MISCELLANEOUS) ×1
BLADE SURG 15 STRL LF DISP TIS (BLADE) ×1 IMPLANT
BLADE SURG 15 STRL SS (BLADE) ×2
CANISTER SUCT 1200ML W/VALVE (MISCELLANEOUS) ×2 IMPLANT
CHLORAPREP W/TINT 26 (MISCELLANEOUS) ×2 IMPLANT
DERMABOND ADVANCED (GAUZE/BANDAGES/DRESSINGS) ×1
DERMABOND ADVANCED .7 DNX12 (GAUZE/BANDAGES/DRESSINGS) ×1 IMPLANT
DEVICE DUBIN SPECIMEN MAMMOGRA (MISCELLANEOUS) ×2 IMPLANT
DRAPE LAPAROTOMY 77X122 PED (DRAPES) ×2 IMPLANT
ELECT CAUTERY BLADE 6.4 (BLADE) ×2 IMPLANT
ELECT REM PT RETURN 9FT ADLT (ELECTROSURGICAL) ×2
ELECTRODE REM PT RTRN 9FT ADLT (ELECTROSURGICAL) ×1 IMPLANT
GAUZE 4X4 16PLY ~~LOC~~+RFID DBL (SPONGE) ×2 IMPLANT
GLOVE SURG ENC MOIS LTX SZ6.5 (GLOVE) ×2 IMPLANT
GLOVE SURG UNDER POLY LF SZ6.5 (GLOVE) ×2 IMPLANT
GOWN STRL REUS W/ TWL LRG LVL3 (GOWN DISPOSABLE) ×2 IMPLANT
GOWN STRL REUS W/TWL LRG LVL3 (GOWN DISPOSABLE) ×4
HEMOSTAT ARISTA ABSORB 3G PWDR (HEMOSTASIS) ×2 IMPLANT
KIT MARKER MARGIN INK (KITS) ×2 IMPLANT
KIT TURNOVER KIT A (KITS) ×2 IMPLANT
LABEL OR SOLS (LABEL) ×2 IMPLANT
MANIFOLD NEPTUNE II (INSTRUMENTS) ×2 IMPLANT
MARGIN MAP 10MM (MISCELLANEOUS) ×2 IMPLANT
MARKER MARGIN CORRECT CLIP (MARKER) IMPLANT
NEEDLE HYPO 25X1 1.5 SAFETY (NEEDLE) ×2 IMPLANT
PACK BASIN MINOR ARMC (MISCELLANEOUS) ×2 IMPLANT
RETRACTOR RING XSMALL (MISCELLANEOUS) ×1 IMPLANT
RETRACTOR WOUND ALXS 18CM SML (MISCELLANEOUS) IMPLANT
RTRCTR WOUND ALEXIS 13CM XS SH (MISCELLANEOUS) ×2
RTRCTR WOUND ALEXIS O 18CM SML (MISCELLANEOUS)
SLEVE PROBE SENORX GAMMA FIND (MISCELLANEOUS) IMPLANT
SPONGE T-LAP 18X18 ~~LOC~~+RFID (SPONGE) ×2 IMPLANT
SUT ETHILON 3-0 FS-10 30 BLK (SUTURE)
SUT MNCRL 4-0 (SUTURE) ×2
SUT MNCRL 4-0 27XMFL (SUTURE) ×1
SUT SILK 2 0 SH (SUTURE) IMPLANT
SUT VIC AB 3-0 SH 27 (SUTURE) ×4
SUT VIC AB 3-0 SH 27X BRD (SUTURE) ×2 IMPLANT
SUT VIC AB 4-0 PS2 18 (SUTURE) ×2 IMPLANT
SUTURE EHLN 3-0 FS-10 30 BLK (SUTURE) IMPLANT
SUTURE MNCRL 4-0 27XMF (SUTURE) ×1 IMPLANT
SYR 10ML LL (SYRINGE) ×2 IMPLANT
WATER STERILE IRR 1000ML POUR (IV SOLUTION) ×2 IMPLANT

## 2021-05-08 NOTE — Anesthesia Procedure Notes (Signed)
Procedure Name: LMA Insertion Date/Time: 05/08/2021 10:22 AM Performed by: Jerrye Noble, CRNA Pre-anesthesia Checklist: Patient identified, Emergency Drugs available, Suction available and Patient being monitored Patient Re-evaluated:Patient Re-evaluated prior to induction Oxygen Delivery Method: Circle system utilized Preoxygenation: Pre-oxygenation with 100% oxygen Induction Type: IV induction Ventilation: Mask ventilation without difficulty LMA: LMA inserted LMA Size: 4.0 Number of attempts: 1 Placement Confirmation: positive ETCO2 and breath sounds checked- equal and bilateral Tube secured with: Tape Dental Injury: Teeth and Oropharynx as per pre-operative assessment

## 2021-05-08 NOTE — Transfer of Care (Signed)
Immediate Anesthesia Transfer of Care Note  Patient: Kristen Singh  Procedure(s) Performed: EXCISION OF BREAST BIOPSY W/ Needle localization (Right: Breast)  Patient Location: PACU  Anesthesia Type:General  Level of Consciousness: drowsy and patient cooperative  Airway & Oxygen Therapy: Patient Spontanous Breathing and Patient connected to face mask oxygen  Post-op Assessment: Report given to RN and Post -op Vital signs reviewed and stable  Post vital signs: Reviewed and stable  Last Vitals:  Vitals Value Taken Time  BP 134/70 05/08/21 1154  Temp 36.1 C 05/08/21 1154  Pulse 77 05/08/21 1156  Resp 9 05/08/21 1156  SpO2 100 % 05/08/21 1156  Vitals shown include unvalidated device data.  Last Pain:  Vitals:   05/08/21 0856  TempSrc: Oral  PainSc: 0-No pain         Complications: No notable events documented.

## 2021-05-08 NOTE — Anesthesia Preprocedure Evaluation (Signed)
Anesthesia Evaluation  Patient identified by MRN, date of birth, ID band Patient awake    Reviewed: Allergy & Precautions, NPO status , Patient's Chart, lab work & pertinent test results  Airway Mallampati: II  TM Distance: >3 FB Neck ROM: Full    Dental no notable dental hx.    Pulmonary neg pulmonary ROS,    Pulmonary exam normal        Cardiovascular hypertension, Pt. on medications + DOE  Normal cardiovascular exam     Neuro/Psych  Headaches, PSYCHIATRIC DISORDERS Anxiety Depression  Neuromuscular disease    GI/Hepatic Neg liver ROS, GERD  Medicated,  Endo/Other  negative endocrine ROS  Renal/GU negative Renal ROS  negative genitourinary   Musculoskeletal  (+) Arthritis , Osteoarthritis,    Abdominal   Peds negative pediatric ROS (+)  Hematology negative hematology ROS (+)   Anesthesia Other Findings . Allergy 1966  codeine  . Anxiety  . Arthritis  Degenerative disc disease in the cervical and lumbar spine, with previous MRI. Previously evaluated by Orthopedics. Followed by Dr. Dossie Arbour at pain management clinic. Radiofrequency ablation 5/09  . Carpal tunnel syndrome  . Cataract cortical, senile 2015  cataract surgery  . Depression  evaluated by Dr. Bridgett Larsson  . 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 GERD (gastroesophageal reflux disease) 10/30/2014  . Hyperlipidemia  . Hypertension 2006  taking Metoprolol  . Migraine  . 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 Osteopenia  history of fractures, alendronate ordered 2014  . Renal stone   Reproductive/Obstetrics negative OB ROS                             Anesthesia Physical Anesthesia Plan  ASA: 2  Anesthesia Plan: General   Post-op Pain Management:    Induction:   PONV Risk Score and Plan: 2 and Propofol infusion and Midazolam  Airway Management Planned: LMA  Additional Equipment:    Intra-op Plan:   Post-operative Plan: Extubation in OR  Informed Consent: I have reviewed the patients History and Physical, chart, labs and discussed the procedure including the risks, benefits and alternatives for the proposed anesthesia with the patient or authorized representative who has indicated his/her understanding and acceptance.       Plan Discussed with: CRNA, Anesthesiologist and Surgeon  Anesthesia Plan Comments:         Anesthesia Quick Evaluation

## 2021-05-08 NOTE — Op Note (Signed)
Preoperative diagnosis: Right breast atypical ductal hyperplasia  Postoperative diagnosis: Same.  Procedure: Right needle-localized partial excisional biopsy                    Anesthesia: GETA  Surgeon: Dr. Windell Moment  Wound Classification: Clean  Indications: Patient is a 80 y.o. female with a nonpalpable right breast distortion noted on mammography with core biopsy demonstrating atypical ductal hyperplasia requires needle-localized excisional biopsy to rule out malignancy.   Findings: 1. Specimen mammography shows marker on specimen 2. No other palpable mass or lymph node identified.   Description of procedure: Preoperative needle localization was performed by radiology. Localization studies were reviewed. The patient was taken to the operating room and placed supine on the operating table, and after general anesthesia the right chest and axilla were prepped and draped in the usual sterile fashion. A time-out was completed verifying correct patient, procedure, site, positioning, and implant(s) and/or special equipment prior to beginning this procedure.  By comparing the localization studies, the probable trajectory and location of the distortions was visualized. A circumareolar skin incision was planned in such a way as to minimize the amount of dissection to reach the mass.  The skin incision was made. Flaps were raised and the location of the wire was confirmed. A 2-0 silk figure-of-eight stay suture was placed and used for retraction. Dissection was then taken down circumferentially, taking care to include the entire localizing needles and a wide margin of grossly normal tissue. The specimen and entire localizing tag were removed. The specimen was oriented and sent to radiology with the localization studies. Confirmation was received that the entire target lesion had been resected. The wound was irrigated. Hemostasis was checked. The wound was closed with interrupted sutures of 3-0 Vicryl  and a subcuticular suture of Monocryl 3-0. No attempt was made to close the dead space.  The patient tolerated the procedure well and was taken to the postanesthesia care unit in stable condition.    Specimen: Right Breast excisional biopsy                    Complications: None  Estimated Blood Loss: 25 mL

## 2021-05-08 NOTE — Interval H&P Note (Signed)
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  The various methods of treatment have been discussed with the patient and family. After consideration of risks, benefits and other options for treatment, the patient has consented to  Procedure(s): EXCISION OF BREAST BIOPSY W/ Needle localization (Right) as a surgical intervention.  The patient's history has been reviewed, patient examined, no change in status, stable for surgery.  I have reviewed the patient's chart and labs.  Questions were answered to the patient's satisfaction.     Herbert Pun

## 2021-05-08 NOTE — Anesthesia Postprocedure Evaluation (Signed)
Anesthesia Post Note  Patient: Kristen Singh  Procedure(s) Performed: EXCISION OF BREAST BIOPSY W/ Needle localization (Right: Breast)  Patient location during evaluation: PACU Anesthesia Type: General Level of consciousness: awake and alert Pain management: pain level controlled Vital Signs Assessment: post-procedure vital signs reviewed and stable Respiratory status: spontaneous breathing, nonlabored ventilation, respiratory function stable and patient connected to nasal cannula oxygen Cardiovascular status: blood pressure returned to baseline and stable Postop Assessment: no apparent nausea or vomiting Anesthetic complications: no   No notable events documented.   Last Vitals:  Vitals:   05/08/21 1230 05/08/21 1244  BP: 140/63 (!) 147/78  Pulse: 78 82  Resp: 19 16  Temp: (!) 36.1 C (!) 36.1 C  SpO2: 94% 95%    Last Pain:  Vitals:   05/08/21 1244  TempSrc: Temporal  PainSc: 0-No pain                 [place] Ouch

## 2021-05-08 NOTE — Discharge Instructions (Addendum)
?  Diet: Resume home heart healthy regular diet.  ? ?Activity: No heavy lifting >20 pounds (children, pets, laundry, garbage) or strenuous activity until follow-up, but light activity and walking are encouraged. Do not drive or drink alcohol if taking narcotic pain medications. ? ?Wound care: May shower with soapy water and pat dry (do not rub incisions), but no baths or submerging incision underwater until follow-up. (no swimming)  ? ?Medications: Resume all home medications. For mild to moderate pain: acetaminophen (Tylenol) or ibuprofen (if no kidney disease). Combining Tylenol with alcohol can substantially increase your risk of causing liver disease. Narcotic pain medications, if prescribed, can be used for severe pain, though may cause nausea, constipation, and drowsiness. If you do not need the narcotic pain medication, you do not need to fill the prescription. ? ?Call office ([phone]) at any time if any questions, worsening pain, fevers/chills, bleeding, drainage from incision site, or other concerns. ? ? ?AMBULATORY SURGERY  ?DISCHARGE INSTRUCTIONS ? ? ?The drugs that you were given will stay in your system until tomorrow so for the next 24 hours you should not: ? ?Drive an automobile ?Make any legal decisions ?Drink any alcoholic beverage ? ? ?You may resume regular meals tomorrow.  Today it is better to start with liquids and gradually work up to solid foods. ? ?You may eat anything you prefer, but it is better to start with liquids, then soup and crackers, and gradually work up to solid foods. ? ? ?Please notify your doctor immediately if you have any unusual bleeding, trouble breathing, redness and pain at the surgery site, drainage, fever, or pain not relieved by medication. ? ? ? ?Additional Instructions: ? ? ? ? ? ? ? ?Please contact your physician with any problems or Same Day Surgery at [phone], Monday through Friday 6 am to 4 pm, or [facility] Main number at [phone].  ?

## 2021-05-09 ENCOUNTER — Encounter: Payer: Self-pay | Admitting: General Surgery

## 2021-05-12 ENCOUNTER — Other Ambulatory Visit: Payer: Self-pay | Admitting: Anatomic Pathology & Clinical Pathology

## 2021-05-12 LAB — SURGICAL PATHOLOGY

## 2021-05-12 NOTE — Progress Notes (Unsigned)
mdt  

## 2021-05-14 DIAGNOSIS — D0511 Intraductal carcinoma in situ of right breast: Secondary | ICD-10-CM

## 2021-05-18 DIAGNOSIS — D0511 Intraductal carcinoma in situ of right breast: Secondary | ICD-10-CM | POA: Insufficient documentation

## 2021-05-18 NOTE — Progress Notes (Signed)
[place]  [phone] [phone]) [phone]  ID: Kristen Singh OB: 15-Aug-1941  MR#: [id number]  CWC#:[id number]  Patient Care Team: Adin Hector, MD as PCP - General (Internal Medicine) Theodore Demark, RN as Oncology Nurse Navigator  CHIEF COMPLAINT: DCIS, right.  INTERVAL HISTORY: Patient is an 80 year old female who was noted to have an abnormality on screening mammogram.  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  Patient subsequently underwent lumpectomy on May 08, 2021 confirming the diagnosis.  She currently feels well and is asymptomatic.  She is fully recovered from her surgery.  She has no neurologic complaints.  Patient denies any recent fevers or illnesses.  She has a good appetite and denies weight loss.  She has no chest pain, shortness of breath, cough, or hemoptysis.  She denies any nausea, vomiting, constipation, or diarrhea.  She has no urinary complaints.  Patient feels at her baseline and offers no specific complaints today.  REVIEW OF SYSTEMS:   Review of Systems  Constitutional: Negative.  Negative for fever, malaise/fatigue and weight loss.  Respiratory: Negative.  Negative for cough, hemoptysis and shortness of breath.   Cardiovascular: Negative.  Negative for chest pain and leg swelling.  Gastrointestinal: Negative.  Negative for abdominal pain.  Genitourinary: Negative.  Negative for dysuria.  Musculoskeletal: Negative.  Negative for back pain.  Skin: Negative.  Negative for rash.  Neurological: Negative.  Negative for dizziness, focal weakness, weakness and headaches.  Psychiatric/Behavioral: Negative.  The patient is not nervous/anxious.    As per HPI. Otherwise, a complete review of systems is negative.  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   Atypical mole 03/29/2017   right medial  shoulder/mild   Basal cell carcinoma 04/10/2019   left lower pretibial   Carpal tunnel syndrome    Cortical cataract    DDD (degenerative disc disease), cervical    DDD (degenerative disc disease), lumbar    Depression    DOE (dyspnea on exertion)    GERD (gastroesophageal reflux disease)    Headache    History of kidney stones    HLD (hyperlipidemia)    Hypertension    Migraines    Osteopenia     PAST SURGICAL HISTORY: Past Surgical History:  Procedure Laterality Date   BREAST BIOPSY Right 02/21/2020   affirm bx calcs, x marker, mild stromal fibrosis, benign, no atypia   BREAST BIOPSY Right 04/06/2021   right breast UIQ calcs, coil marker --> path showed COLUMNAR CELL CHANGE, FIBROADENOMATOID CHANGES, SCLEROSING ADENOSIS, LOBULAR NEOPLASIA   BREAST BIOPSY Right 04/06/2021   UIQ calcs, ribbon marker --> path showed FOCAL ATYPICAL DUCTAL HYPERPLASIA (ADH) IN A BACKGROUND OF COLUMNAR CELL CHANGE WITH USUAL DUCTAL HYPERPLASIA, APOCRINE METAPLASIA, SCLEROSING ADENOSIS, LOBULAR NEOPLASIA   CATARACT EXTRACTION, BILATERAL N/A    CHOLECYSTECTOMY     COLONOSCOPY     EXCISION OF BREAST BIOPSY Right 05/08/2021   Procedure: EXCISION OF BREAST BIOPSY W/ Needle localization;  Surgeon: Herbert Pun, MD;  Location: [facility] ORS;  Service: General;  Laterality: Right;   TONSILLECTOMY     WISDOM TOOTH EXTRACTION      FAMILY HISTORY: Family History  Problem Relation Age of Onset   Breast cancer Sister 17   Breast cancer Maternal Grandmother        21 or 44   Breast cancer Sister 34    ADVANCED  DIRECTIVES (Y/N):  N  HEALTH MAINTENANCE: Social History   Tobacco Use   Smoking status: Never    Passive exposure: Never   Smokeless tobacco: Never  Substance Use Topics   Alcohol use: Not Currently   Drug use: Never     Colonoscopy:  PAP:  Bone density:  Lipid panel:  Allergies  Allergen Reactions   Codeine Other (See Comments)    Current Outpatient Medications   Medication Sig Dispense Refill   acetaminophen (TYLENOL) 325 MG tablet Take 650 mg by mouth every 6 (six) hours as needed.     alendronate (FOSAMAX) 70 MG tablet Take 70 mg by mouth once a week.     ALPRAZolam (XANAX) 0.25 MG tablet Take by mouth as needed.     aspirin 81 MG chewable tablet Chew by mouth.     calcium citrate-vitamin D (CITRACAL+D) 315-200 MG-UNIT tablet Take 1 tablet by mouth 2 (two) times daily.     Cetirizine HCl 10 MG CAPS Take by mouth.     Ciclopirox 1 % shampoo Shampoo into scalp let sit 10 minutes then wash out. Use 2-3 days per week. 120 mL 6   citalopram (CELEXA) 10 MG tablet Take 10 mg by mouth daily.     clobetasol (TEMOVATE) 0.05 % external solution APPLY TO SKIN AS DIRECTED, APPLY TO AFFECTED AREA OF SCALP EACH DAY NOT SHAMPOOING SCALP 50 mL 3   DULoxetine (CYMBALTA) 60 MG capsule Take 1 tablet by mouth daily.     folic acid (FOLVITE) 694 MCG tablet Take by mouth.     gabapentin (NEURONTIN) 800 MG tablet Take by mouth 2 (two) times daily.     hydrochlorothiazide (HYDRODIURIL) 25 MG tablet Take 25 mg by mouth daily.     ipratropium (ATROVENT) 0.03 % nasal spray SMARTSIG:2 Spray(s) Both Nares Every 8 Hours PRN     ketoconazole (NIZORAL) 2 % cream Apply 1 application topically daily as needed for irritation.     metoprolol tartrate (LOPRESSOR) 50 MG tablet Take by mouth 2 (two) times daily.     omeprazole (PRILOSEC) 20 MG capsule Take 1 tablet by mouth daily.     pravastatin (PRAVACHOL) 40 MG tablet TAKE 1 TABLET BY MOUTH EVERY DAY AT NIGHT     simethicone (MYLICON) 80 MG chewable tablet Chew 80 mg by mouth every 6 (six) hours as needed for flatulence.     metroNIDAZOLE (METROCREAM) 0.75 % cream For rosacea apply a thin coat to the face QHS. 45 g 6   traMADol (ULTRAM) 50 MG tablet Take 1 tablet (50 mg total) by mouth every 6 (six) hours as needed. (Patient not taking: No sig reported) 20 tablet 0   No current facility-administered medications for this visit.     OBJECTIVE: There were no vitals filed for this visit.   There is no height or weight on file to calculate BMI.    ECOG FS:0 - Asymptomatic  General: Well-developed, well-nourished, no acute distress. Eyes: Pink conjunctiva, anicteric sclera. HEENT: Normocephalic, moist mucous membranes. Breast:  Exam preformed by other provider. Lungs: No audible wheezing or coughing. Heart: Regular rate and rhythm. Abdomen: Soft, nontender, no obvious distention. Musculoskeletal: No edema, cyanosis, or clubbing. Neuro: Alert, answering all questions appropriately. Cranial nerves grossly intact. Skin: No rashes or petechiae noted. Psych: Normal affect. Lymphatics: No cervical, calvicular, axillary or inguinal LAD.   LAB RESULTS:  No results found for: NA, K, CL, CO2, GLUCOSE, BUN, CREATININE, CALCIUM, PROT, ALBUMIN, AST, ALT, ALKPHOS, BILITOT, GFRNONAA, GFRAA  No results found for: WBC, NEUTROABS, HGB, HCT, MCV, PLT   STUDIES: MM Breast Surgical Specimen  Result Date: 05/08/2021 CLINICAL DATA:  Patient status post right breast excision. EXAM: SPECIMEN RADIOGRAPH OF THE RIGHT BREAST COMPARISON:  Previous exam(s). FINDINGS: Status post excision of the right breast. The wire tip and biopsy marker clips are present and are marked for pathology. The second wire was pulled out of the specimen in the operating room. IMPRESSION: Specimen radiograph of the right breast. Electronically Signed   By: Lovey Newcomer M.D.   On: 05/08/2021 11:26  MM RT PLC BREAST LOC DEV   1ST LESION  INC MAMMO GUIDE  Result Date: 05/08/2021 CLINICAL DATA:  Patient for preoperative localization prior to right breast excision. EXAM: NEEDLE LOCALIZATION OF THE RIGHT BREAST WITH MAMMO GUIDANCE COMPARISON:  Previous exams. PROCEDURE: Patient presents for needle localization prior to right breast excision. I met with the patient and we discussed the procedure of needle localization including benefits and alternatives. We discussed the  high likelihood of a successful procedure. We discussed the risks of the procedure, including infection, bleeding, tissue injury, and further surgery. Informed, written consent was given. The usual time-out protocol was performed immediately prior to the procedure. Using mammographic guidance, sterile technique, 1% lidocaine and a 7 modified Kopans needle, coil shaped clip was localized using medial approach. Using mammographic guidance, sterile technique, 1% lidocaine and a 7 modified Kopans needle, ribbon shaped clip was localized using medial approach. The images were marked for Dr. Windell Moment. IMPRESSION: Needle localization right breast. No apparent complications. Electronically Signed   By: Lovey Newcomer M.D.   On: 05/08/2021 09:28  MM RT PLC BREAST LOC DEV   EA ADD LESION  INC MAMMO GUIDE  Result Date: 05/08/2021 CLINICAL DATA:  Patient for preoperative localization prior to right breast excision. EXAM: NEEDLE LOCALIZATION OF THE RIGHT BREAST WITH MAMMO GUIDANCE COMPARISON:  Previous exams. PROCEDURE: Patient presents for needle localization prior to right breast excision. I met with the patient and we discussed the procedure of needle localization including benefits and alternatives. We discussed the high likelihood of a successful procedure. We discussed the risks of the procedure, including infection, bleeding, tissue injury, and further surgery. Informed, written consent was given. The usual time-out protocol was performed immediately prior to the procedure. Using mammographic guidance, sterile technique, 1% lidocaine and a 7 modified Kopans needle, coil shaped clip was localized using medial approach. Using mammographic guidance, sterile technique, 1% lidocaine and a 7 modified Kopans needle, ribbon shaped clip was localized using medial approach. The images were marked for Dr. Windell Moment. IMPRESSION: Needle localization right breast. No apparent complications. Electronically Signed   By: Lovey Newcomer M.D.   On: 05/08/2021 09:28   ASSESSMENT: DCIS, right.  PLAN:    DCIS, right: Patient underwent lumpectomy on May 08, 2021 confirming diagnosis.  She also had evaluation by radiation oncology today and will initiate adjuvant XRT next week.  Patient will benefit from tamoxifen for a total of 5 years which will be started at the conclusion of her XRT.  No further intervention is needed at this time.  Return to clinic in 5 to 6 weeks at the conclusion of XRT for further evaluation and initiation of tamoxifen.  I spent a total of 60 minutes reviewing chart data, face-to-face evaluation with the patient, counseling and coordination of care as detailed above.   Patient expressed understanding and was in agreement with this plan. She also understands that She can call  clinic at any time with any questions, concerns, or complaints.   Cancer Staging Breast neoplasm, Tis (DCIS), right Staging form: Breast, AJCC 8th Edition - Clinical stage from 05/18/2021: Stage 0 (cTis (DCIS), cN0, cM0, ER+, PR+, HER2-) - Signed by Lloyd Huger, MD on 05/18/2021 Stage prefix: Initial diagnosis Nuclear grade: Patrecia Pour, MD   05/21/2021 7:36 AM

## 2021-05-20 ENCOUNTER — Inpatient Hospital Stay: Payer: Medicare Other

## 2021-05-20 ENCOUNTER — Inpatient Hospital Stay: Payer: Medicare Other | Attending: Oncology | Admitting: Oncology

## 2021-05-20 ENCOUNTER — Encounter: Payer: Self-pay | Admitting: Radiation Oncology

## 2021-05-20 ENCOUNTER — Ambulatory Visit
Admission: RE | Admit: 2021-05-20 | Discharge: 2021-05-20 | Disposition: A | Discharge status: Home / Self Care | Payer: Medicare Other | Source: Ambulatory Visit | Attending: Radiation Oncology | Admitting: Radiation Oncology

## 2021-05-20 ENCOUNTER — Encounter: Payer: Self-pay | Admitting: Oncology

## 2021-05-20 VITALS — BP 128/76 | HR 75 | Temp 97.1°F | Resp 16 | Wt 156.5 lb

## 2021-05-20 DIAGNOSIS — D0511 Intraductal carcinoma in situ of right breast: Secondary | ICD-10-CM | POA: Insufficient documentation

## 2021-05-20 DIAGNOSIS — Z803 Family history of malignant neoplasm of breast: Secondary | ICD-10-CM | POA: Diagnosis not present

## 2021-05-20 DIAGNOSIS — Z85820 Personal history of malignant melanoma of skin: Secondary | ICD-10-CM | POA: Insufficient documentation

## 2021-05-20 DIAGNOSIS — Z79810 Long term (current) use of selective estrogen receptor modulators (SERMs): Secondary | ICD-10-CM | POA: Insufficient documentation

## 2021-05-20 DIAGNOSIS — C50211 Malignant neoplasm of upper-inner quadrant of right female breast: Secondary | ICD-10-CM | POA: Insufficient documentation

## 2021-05-20 DIAGNOSIS — Z79899 Other long term (current) drug therapy: Secondary | ICD-10-CM | POA: Diagnosis not present

## 2021-05-20 DIAGNOSIS — I10 Essential (primary) hypertension: Secondary | ICD-10-CM | POA: Insufficient documentation

## 2021-05-20 DIAGNOSIS — Z170 Estrogen receptor positive status [ER+]: Secondary | ICD-10-CM | POA: Insufficient documentation

## 2021-05-20 DIAGNOSIS — M5136 Other intervertebral disc degeneration, lumbar region: Secondary | ICD-10-CM | POA: Diagnosis not present

## 2021-05-20 DIAGNOSIS — Z87442 Personal history of urinary calculi: Secondary | ICD-10-CM | POA: Diagnosis not present

## 2021-05-20 DIAGNOSIS — E785 Hyperlipidemia, unspecified: Secondary | ICD-10-CM | POA: Insufficient documentation

## 2021-05-20 DIAGNOSIS — M129 Arthropathy, unspecified: Secondary | ICD-10-CM | POA: Diagnosis not present

## 2021-05-20 DIAGNOSIS — M5030 Other cervical disc degeneration, unspecified cervical region: Secondary | ICD-10-CM | POA: Diagnosis not present

## 2021-05-20 DIAGNOSIS — M8580 Other specified disorders of bone density and structure, unspecified site: Secondary | ICD-10-CM | POA: Insufficient documentation

## 2021-05-20 DIAGNOSIS — K219 Gastro-esophageal reflux disease without esophagitis: Secondary | ICD-10-CM | POA: Insufficient documentation

## 2021-05-20 NOTE — Consult Note (Signed)
NEW PATIENT EVALUATION  Name: Kristen Singh  MRN: [id number]  Date:   05/20/2021     DOB: August 31, 1941   This 80 y.o. 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  REFERRING PHYSICIAN: Adin Hector, MD  CHIEF COMPLAINT:  Chief Complaint  Patient presents with   Breast Cancer    Initial consultation    DIAGNOSIS: The encounter diagnosis was Breast neoplasm, Tis (DCIS), right.   PREVIOUS INVESTIGATIONS:  Mammogram and ultrasound reviewed Pathology reviewed Clinical notes reviewed  HPI: Patient is a 80 year old female being followed by 2 adjacent areas of calcifications within the upper inner right breast.  Recommendation was made for stereotactic guided core biopsy which was performed showing focal atypical ductal hyperplasia in a background of columnar cell with usual ductal hyperplasia.  There was also apocrine metaplasia sclerosing adenosis and lobular neoplasia.  She went on to have a wide local excision showing at least 5 mm of ductal carcinoma in situ low-grade.  Margins were clear at 2.2 mm.  No lymph nodes were submitted.  She is done well postoperatively.  She specifically denies breast tenderness cough or bone pain.  She is seen today for radiation oncology opinion.  PLANNED TREATMENT REGIMEN: Right whole breast radiation  PAST MEDICAL HISTORY:  has a past medical history of Anxiety, Arthritis, Atypical ductal hyperplasia of right breast (04/06/2021), Atypical mole (03/29/2017), Basal cell carcinoma (04/10/2019), Carpal tunnel syndrome, Cortical cataract, DDD (degenerative disc disease), cervical, DDD (degenerative disc disease), lumbar, Depression, DOE (dyspnea on exertion), GERD (gastroesophageal reflux disease), Headache, History of kidney stones, HLD (hyperlipidemia), Hypertension, Migraines, and Osteopenia.    PAST SURGICAL HISTORY:  Past Surgical  History:  Procedure Laterality Date   BREAST BIOPSY Right 02/21/2020   affirm bx calcs, x marker, mild stromal fibrosis, benign, no atypia   BREAST BIOPSY Right 04/06/2021   right breast UIQ calcs, coil marker --> path showed COLUMNAR CELL CHANGE, FIBROADENOMATOID CHANGES, SCLEROSING ADENOSIS, LOBULAR NEOPLASIA   BREAST BIOPSY Right 04/06/2021   UIQ calcs, ribbon marker --> path showed FOCAL ATYPICAL DUCTAL HYPERPLASIA (ADH) IN A BACKGROUND OF COLUMNAR CELL CHANGE WITH USUAL DUCTAL HYPERPLASIA, APOCRINE METAPLASIA, SCLEROSING ADENOSIS, LOBULAR NEOPLASIA   CATARACT EXTRACTION, BILATERAL N/A    CHOLECYSTECTOMY     COLONOSCOPY     EXCISION OF BREAST BIOPSY Right 05/08/2021   Procedure: EXCISION OF BREAST BIOPSY W/ Needle localization;  Surgeon: Herbert Pun, MD;  Location: [facility] ORS;  Service: General;  Laterality: Right;   TONSILLECTOMY     WISDOM TOOTH EXTRACTION      FAMILY HISTORY: family history includes Breast cancer in her maternal grandmother; Breast cancer (age of onset: 81) in her sister; Breast cancer (age of onset: 70) in her sister.  SOCIAL HISTORY:  reports that she has never smoked. She has never been exposed to tobacco smoke. She has never used smokeless tobacco. She reports previous alcohol use. She reports that she does not use drugs.  ALLERGIES: Codeine  MEDICATIONS:  Current Outpatient Medications  Medication Sig Dispense Refill   acetaminophen (TYLENOL) 325 MG tablet Take 650 mg by mouth every 6 (six) hours as needed.     alendronate (FOSAMAX) 70 MG tablet Take 70 mg by mouth once a week.     ALPRAZolam (XANAX) 0.25 MG tablet Take by mouth as needed.     aspirin 81 MG chewable tablet Chew by mouth.  calcium citrate-vitamin D (CITRACAL+D) 315-200 MG-UNIT tablet Take 1 tablet by mouth 2 (two) times daily.     Cetirizine HCl 10 MG CAPS Take by mouth.     Ciclopirox 1 % shampoo Shampoo into scalp let sit 10 minutes then wash out. Use 2-3 days per week. 120 mL 6    citalopram (CELEXA) 10 MG tablet Take 10 mg by mouth daily.     clobetasol (TEMOVATE) 0.05 % external solution APPLY TO SKIN AS DIRECTED, APPLY TO AFFECTED AREA OF SCALP EACH DAY NOT SHAMPOOING SCALP 50 mL 3   DULoxetine (CYMBALTA) 60 MG capsule Take 1 tablet by mouth daily.     folic acid (FOLVITE) Q000111Q MCG tablet Take by mouth.     gabapentin (NEURONTIN) 800 MG tablet Take by mouth 2 (two) times daily.     hydrochlorothiazide (HYDRODIURIL) 25 MG tablet Take 25 mg by mouth daily.     ipratropium (ATROVENT) 0.03 % nasal spray SMARTSIG:2 Spray(s) Both Nares Every 8 Hours PRN     ketoconazole (NIZORAL) 2 % cream Apply 1 application topically daily as needed for irritation.     metoprolol tartrate (LOPRESSOR) 50 MG tablet Take by mouth 2 (two) times daily.     metroNIDAZOLE (METROCREAM) 0.75 % cream For rosacea apply a thin coat to the face QHS. 45 g 6   omeprazole (PRILOSEC) 20 MG capsule Take 1 tablet by mouth daily.     pravastatin (PRAVACHOL) 40 MG tablet TAKE 1 TABLET BY MOUTH EVERY DAY AT NIGHT     simethicone (MYLICON) 80 MG chewable tablet Chew 80 mg by mouth every 6 (six) hours as needed for flatulence.     traMADol (ULTRAM) 50 MG tablet Take 1 tablet (50 mg total) by mouth every 6 (six) hours as needed. (Patient not taking: Reported on 05/20/2021) 20 tablet 0   No current facility-administered medications for this encounter.    ECOG PERFORMANCE STATUS:  0 - Asymptomatic  REVIEW OF SYSTEMS: Patient denies any weight loss, fatigue, weakness, fever, chills or night sweats. Patient denies any loss of vision, blurred vision. Patient denies any ringing  of the ears or hearing loss. No irregular heartbeat. Patient denies heart murmur or history of fainting. Patient denies any chest pain or pain radiating to her upper extremities. Patient denies any shortness of breath, difficulty breathing at night, cough or hemoptysis. Patient denies any swelling in the lower legs. Patient denies any nausea  vomiting, vomiting of blood, or coffee ground material in the vomitus. Patient denies any stomach pain. Patient states has had normal bowel movements no significant constipation or diarrhea. Patient denies any dysuria, hematuria or significant nocturia. Patient denies any problems walking, swelling in the joints or loss of balance. Patient denies any skin changes, loss of hair or loss of weight. Patient denies any excessive worrying or anxiety or significant depression. Patient denies any problems with insomnia. Patient denies excessive thirst, polyuria, polydipsia. Patient denies any swollen glands, patient denies easy bruising or easy bleeding. Patient denies any recent infections, allergies or URI. Patient "s visual fields have not changed significantly in recent time.   PHYSICAL EXAM: BP 128/76 (BP Location: Left Arm, Patient Position: Sitting)   Pulse 75   Temp (!) 97.1 F (36.2 C) (Tympanic)   Resp 16   Wt 156 lb 8 oz (71 kg)   BMI 28.62 kg/m  She is status post wide local excision of the right breast located approximately 2 o'clock position.  No dominant masses noted in either  breast.  No axillary or supraclavicular adenopathy is identified.  Well-developed well-nourished patient in NAD. HEENT reveals PERLA, EOMI, discs not visualized.  Oral cavity is clear. No oral mucosal lesions are identified. Neck is clear without evidence of cervical or supraclavicular adenopathy. Lungs are clear to A&P. Cardiac examination is essentially unremarkable with regular rate and rhythm without murmur rub or thrill. Abdomen is benign with no organomegaly or masses noted. Motor sensory and DTR levels are equal and symmetric in the upper and lower extremities. Cranial nerves II through XII are grossly intact. Proprioception is intact. No peripheral adenopathy or edema is identified. No motor or sensory levels are noted. Crude visual fields are within normal range.  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  We will treat in a hypofractionated course over 3 weeks boosting her scar another 1000 cGy using electron beam.  Risks and benefits of treatment including skin reaction fatigue alteration of blood counts possible inclusion of superficial lung all were described in detail to the patient.  She seems to comprehend my treatment plan well.  I have personally set up and ordered CT simulation for next week.  Depending on her ER status she may benefit from antiestrogen therapy and that is being followed by Dr. Grayland Ormond.  Patient knows to call with any concerns.  I would like to take this opportunity to thank you for allowing me to participate in the care of your patient.Noreene Filbert, MD

## 2021-05-26 ENCOUNTER — Ambulatory Visit: Payer: Medicare Other

## 2021-06-08 ENCOUNTER — Ambulatory Visit: Payer: Medicare Other

## 2021-06-11 ENCOUNTER — Telehealth: Payer: Self-pay

## 2021-06-11 NOTE — Telephone Encounter (Signed)
Patient was schedued for CT sim for radiation treatments but the appts were cancelled because she did not want to receive radiation.  Dr. Jackquline Berlin request [place] to contact patient to schedule appointment to discuss initiating oral Tamoxifen but pt declined.  Patient as advised if at anytime she changes her mind to call the office.

## 2021-06-25 ENCOUNTER — Ambulatory Visit: Payer: Medicare Other | Admitting: Oncology

## 2022-03-18 ENCOUNTER — Other Ambulatory Visit: Payer: Self-pay | Admitting: General Surgery

## 2022-03-18 DIAGNOSIS — Z853 Personal history of malignant neoplasm of breast: Secondary | ICD-10-CM

## 2022-04-05 ENCOUNTER — Ambulatory Visit
Admission: RE | Admit: 2022-04-05 | Discharge: 2022-04-05 | Disposition: A | Discharge status: Home / Self Care | Payer: Medicare Other | Source: Ambulatory Visit | Attending: General Surgery | Admitting: General Surgery

## 2022-04-05 DIAGNOSIS — Z853 Personal history of malignant neoplasm of breast: Secondary | ICD-10-CM | POA: Insufficient documentation

## 2022-05-17 ENCOUNTER — Ambulatory Visit (INDEPENDENT_AMBULATORY_CARE_PROVIDER_SITE_OTHER): Payer: Medicare Other | Admitting: Dermatology

## 2022-05-17 DIAGNOSIS — L820 Inflamed seborrheic keratosis: Secondary | ICD-10-CM | POA: Diagnosis not present

## 2022-05-17 DIAGNOSIS — Z85828 Personal history of other malignant neoplasm of skin: Secondary | ICD-10-CM

## 2022-05-17 DIAGNOSIS — L720 Epidermal cyst: Secondary | ICD-10-CM

## 2022-05-17 DIAGNOSIS — L821 Other seborrheic keratosis: Secondary | ICD-10-CM

## 2022-05-17 DIAGNOSIS — L814 Other melanin hyperpigmentation: Secondary | ICD-10-CM

## 2022-05-17 DIAGNOSIS — L219 Seborrheic dermatitis, unspecified: Secondary | ICD-10-CM | POA: Diagnosis not present

## 2022-05-17 DIAGNOSIS — Z1283 Encounter for screening for malignant neoplasm of skin: Secondary | ICD-10-CM | POA: Diagnosis not present

## 2022-05-17 DIAGNOSIS — L719 Rosacea, unspecified: Secondary | ICD-10-CM

## 2022-05-17 DIAGNOSIS — D229 Melanocytic nevi, unspecified: Secondary | ICD-10-CM

## 2022-05-17 DIAGNOSIS — D692 Other nonthrombocytopenic purpura: Secondary | ICD-10-CM

## 2022-05-17 DIAGNOSIS — D1800 Hemangioma unspecified site: Secondary | ICD-10-CM

## 2022-05-17 DIAGNOSIS — L578 Other skin changes due to chronic exposure to nonionizing radiation: Secondary | ICD-10-CM

## 2022-05-17 MED ORDER — KETOCONAZOLE 2 % EX CREA: 1.0000 | TOPICAL_CREAM | Freq: Every day | CUTANEOUS | 6 refills | Status: DC

## 2022-05-17 MED ORDER — CLOBETASOL PROPIONATE 0.05 % EX SOLN: CUTANEOUS | 3 refills | Status: DC

## 2022-05-17 MED ORDER — METRONIDAZOLE 0.75 % EX CREA: TOPICAL_CREAM | CUTANEOUS | 6 refills | Status: AC

## 2022-05-17 MED ORDER — TRETINOIN 0.05 % EX CREA: TOPICAL_CREAM | Freq: Every day | CUTANEOUS | 6 refills | Status: DC

## 2022-05-17 NOTE — Progress Notes (Signed)
Follow-Up Visit   Subjective  [place] is a 81 y.o. female who presents for the following: Total body skin exam (Hx of BCC L lower pretibia, hx of Dysplastic nevus R med shoulder), Rosacea (Face, 1 yr f/u, Metronidazole 0.75% cr qhs), Seborrheic Dermatitis (Scalp, face, 1 yr f/u, Ciclopirox shampoo 1x/wk scalp but ran out,  Clobetasol sol prn scalp, not using ketoconazole cream on face), and check spot (R wrist, just noticed).  The patient presents for Total-Body Skin Exam (TBSE) for skin cancer screening and mole check.  The patient has spots, moles and lesions to be evaluated, some may be new or changing and the patient has concerns that these could be cancer.   The following portions of the chart were reviewed this encounter and updated as appropriate:       Review of Systems:  No other skin or systemic complaints except as noted in HPI or Assessment and Plan.  Objective  Well appearing patient in no apparent distress; mood and affect are within normal limits.  A full examination was performed including scalp, head, eyes, ears, nose, lips, neck, chest, axillae, abdomen, back, buttocks, bilateral upper extremities, bilateral lower extremities, hands, feet, fingers, toes, fingernails, and toenails. All findings within normal limits unless otherwise noted below.  face Mild erythema malar cheeks, nose, glabella  scalp, face Mild erythema/scale scalp, eyebrows   L upper elbow 1.0cm indistinct slightly waxy pink macule no scale     face Smooth white papule(s).     Assessment & Plan   Lentigines - Scattered tan macules - Due to sun exposure - Benign-appearing, observe - Recommend daily broad spectrum sunscreen SPF 30+ to sun-exposed areas, reapply every 2 hours as needed. - Call for any changes - arms  Seborrheic Keratoses - Stuck-on, waxy, tan-brown papules and/or plaques  - Benign-appearing - Discussed benign etiology and prognosis. - Observe - Call for  any changes - arms, back, abdomen  Melanocytic Nevi - Tan-brown and/or pink-flesh-colored symmetric macules and papules - Benign appearing on exam today - Observation - Call clinic for new or changing moles - Recommend daily use of broad spectrum spf 30+ sunscreen to sun-exposed areas.  - arms  Hemangiomas - Red papules - Discussed benign nature - Observe - Call for any changes - trunk  Actinic Damage - Chronic condition, secondary to cumulative UV/sun exposure - diffuse scaly erythematous macules with underlying dyspigmentation - Recommend daily broad spectrum sunscreen SPF 30+ to sun-exposed areas, reapply every 2 hours as needed.  - Staying in the shade or wearing long sleeves, sun glasses (UVA+UVB protection) and wide brim hats (4-inch brim around the entire circumference of the hat) are also recommended for sun protection.  - Call for new or changing lesions.  Skin cancer screening performed today.   History of Basal Cell Carcinoma of the Skin - No evidence of recurrence today - Recommend regular full body skin exams - Recommend daily broad spectrum sunscreen SPF 30+ to sun-exposed areas, reapply every 2 hours as needed.  - Call if any new or changing lesions are noted between office visits  - L lower pretibia  History of Dysplastic Nevi - No evidence of recurrence today - Recommend regular full body skin exams - Recommend daily broad spectrum sunscreen SPF 30+ to sun-exposed areas, reapply every 2 hours as needed.  - Call if any new or changing lesions are noted between office visits  - R medial shoulder  Purpura - Chronic; persistent and recurrent.  Treatable, but  not curable. 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 Currently well-controlled.   Rosacea is a chronic progressive skin condition usually affecting the face of adults, causing redness and/or acne bumps. It is treatable but not curable. It sometimes affects the eyes (ocular rosacea) as well. It may respond to topical and/or systemic medication and can flare with stress, sun exposure, alcohol, exercise and some foods.  Daily application of broad spectrum spf 30+ sunscreen to face is recommended to reduce flares.  Continue metrocream qd/bid to face  metroNIDAZOLE (METROCREAM) 0.75 % cream - face For rosacea apply a thin coat to the face QHS.  Seborrheic dermatitis scalp, face  Chronic and persistent condition with duration or expected duration over one year. Condition is symptomatic/ bothersome to patient. Not currently at goal.   Seborrheic Dermatitis  -  is a chronic persistent rash characterized by pinkness and scaling most commonly of the mid face but also can occur on the scalp (dandruff), ears; mid chest, mid back and groin.  It tends to be exacerbated by stress and cooler weather.  People who have neurologic disease may experience new onset or exacerbation of existing seborrheic dermatitis.  The condition is not curable but treatable and can be controlled.  Pt prefers to use OTC shampoo since Rx didn't seem to help that much. Cont T gel shampoo and H & S shampoo, pt will call for Rx shampoo if not effective Cont Clobetasol solution qd up to 5d/wk aa scalp prn flares Start Ketoconazole 2% cr qd to scaly areas eyebrows, face prn flares  Topical steroids (such as triamcinolone, fluocinolone, fluocinonide, mometasone, clobetasol, halobetasol, betamethasone, hydrocortisone) can cause thinning and lightening of the skin if they are used for  too long in the same area. Your physician has selected the right strength medicine for your problem and area affected on the body. Please use your medication only as directed by your physician to prevent side effects.     ketoconazole (NIZORAL) 2 % cream - scalp, face Apply 1 Application topically daily. Qd to scaly areas face, eyebrows as needed  Related Medications clobetasol (TEMOVATE) 0.05 % external solution Apply to aa scalp qd up to 5 days a week prn itchy flares, avoid face, groin, axilla  Inflamed seborrheic keratosis L upper elbow  Vs Early BCC Discussed LN2 vs Bx vs Observation, pt prefers to observe  RTC if changes, Recheck on f/up  Milia face  Benign    Start Tretinoin 0.05% cr qhs as tolerated to aa white bumps on face  Topical retinoid medications like tretinoin/Retin-A, adapalene/Differin, tazarotene/Fabior, and Epiduo/Epiduo Forte can cause dryness and irritation when first started. Only apply a pea-sized amount to the entire affected area. Avoid applying it around the eyes, edges of mouth and creases at the nose. If you experience irritation, use a good moisturizer first and/or apply the medicine less often. If you are doing well with the medicine, you can increase how often you use it until you are applying every night. Be careful with sun protection while using this medication  as it can make you sensitive to the sun. This medicine should not be used by pregnant women.    tretinoin (RETIN-A) 0.05 % cream - face Apply topically at bedtime. Qhs as tolerated to white bumps on face   Return in about 1 year (around 05/18/2023) for TBSE, Hx of BCC, Hx of Dysplastic nevi.  I, Othelia Pulling, RMA, am acting as scribe for Brendolyn Patty, MD .  Documentation: I have reviewed the above documentation for accuracy and completeness, and I agree with the above.  Brendolyn Patty MD

## 2022-05-17 NOTE — Patient Instructions (Signed)
Due to recent changes in healthcare laws, you may see results of your pathology and/or laboratory studies on MyChart before the doctors have had a chance to review them. We understand that in some cases there may be results that are confusing or concerning to you. Please understand that not all results are received at the same time and often the doctors may need to interpret multiple results in order to provide you with the best plan of care or course of treatment. Therefore, we ask that you please give us 2 business days to thoroughly review all your results before contacting the office for clarification. Should we see a critical lab result, you will be contacted sooner.   If You Need Anything After Your Visit  If you have any questions or concerns for your doctor, please call our main line at [phone] and press option 4 to reach your doctor's medical assistant. If no one answers, please leave a voicemail as directed and we will return your call as soon as possible. Messages left after 4 pm will be answered the following business day.   You may also send us a message via MyChart. We typically respond to MyChart messages within 1-2 business days.  For prescription refills, please ask your pharmacy to contact our office. Our fax number is [phone].  If you have an urgent issue when the clinic is closed that cannot wait until the next business day, you can page your doctor at the number below.    Please note that while we do our best to be available for urgent issues outside of office hours, we are not available 24/7.   If you have an urgent issue and are unable to reach us, you may choose to seek medical care at your doctor's office, retail clinic, urgent care center, or emergency room.  If you have a medical emergency, please immediately call 911 or go to the emergency department.  Pager Numbers  - Dr. Kowalski: [phone]  - Dr. Moye: [phone]  - Dr. Stewart:  [phone]  In the event of inclement weather, please call our main line at [phone] for an update on the status of any delays or closures.  Dermatology Medication Tips: Please keep the boxes that topical medications come in in order to help keep track of the instructions about where and how to use these. Pharmacies typically print the medication instructions only on the boxes and not directly on the medication tubes.   If your medication is too expensive, please contact our office at [phone] option 4 or send us a message through MyChart.   We are unable to tell what your co-pay for medications will be in advance as this is different depending on your insurance coverage. However, we may be able to find a substitute medication at lower cost or fill out paperwork to get insurance to cover a needed medication.   If a prior authorization is required to get your medication covered by your insurance company, please allow us 1-2 business days to complete this process.  Drug prices often vary depending on where the prescription is filled and some pharmacies may offer cheaper prices.  The website www.goodrx.com contains coupons for medications through different pharmacies. The prices here do not account for what the cost may be with help from insurance (it may be cheaper with your insurance), but the website can give you the price if you did not use any insurance.  - You can print the associated coupon and take it with   your prescription to the pharmacy.  - You may also stop by our office during regular business hours and pick up a GoodRx coupon card.  - If you need your prescription sent electronically to a different pharmacy, notify our office through [facility] MyChart or by phone at [phone] option 4.     Si Usted Necesita Algo Despus de Su Visita  Tambin puede enviarnos un mensaje a travs de MyChart. Por lo general respondemos a los mensajes de MyChart en el transcurso de 1 a 2  das hbiles.  Para renovar recetas, por favor pida a su farmacia que se ponga en contacto con nuestra oficina. Nuestro nmero de fax es el [phone].  Si tiene un asunto urgente cuando la clnica est cerrada y que no puede esperar hasta el siguiente da hbil, puede llamar/localizar a su doctor(a) al nmero que aparece a continuacin.   Por favor, tenga en cuenta que aunque hacemos todo lo posible para estar disponibles para asuntos urgentes fuera del horario de oficina, no estamos disponibles las 24 horas del da, los 7 das de la semana.   Si tiene un problema urgente y no puede comunicarse con nosotros, puede optar por buscar atencin mdica  en el consultorio de su doctor(a), en una clnica privada, en un centro de atencin urgente o en una sala de emergencias.  Si tiene una emergencia mdica, por favor llame inmediatamente al 911 o vaya a la sala de emergencias.  Nmeros de bper  - Dr. Kowalski: [phone]  - Dra. Moye: [phone]  - Dra. Stewart: [phone]  En caso de inclemencias del tiempo, por favor llame a nuestra lnea principal al [phone] para una actualizacin sobre el estado de cualquier retraso o cierre.  Consejos para la medicacin en dermatologa: Por favor, guarde las cajas en las que vienen los medicamentos de uso tpico para ayudarle a seguir las instrucciones sobre dnde y cmo usarlos. Las farmacias generalmente imprimen las instrucciones del medicamento slo en las cajas y no directamente en los tubos del medicamento.   Si su medicamento es muy caro, por favor, pngase en contacto con nuestra oficina llamando al [phone] y presione la opcin 4 o envenos un mensaje a travs de MyChart.   No podemos decirle cul ser su copago por los medicamentos por adelantado ya que esto es diferente dependiendo de la cobertura de su seguro. Sin embargo, es posible que podamos encontrar un medicamento sustituto a menor costo o llenar un formulario para que el  seguro cubra el medicamento que se considera necesario.   Si se requiere una autorizacin previa para que su compaa de seguros cubra su medicamento, por favor permtanos de 1 a 2 das hbiles para completar este proceso.  Los precios de los medicamentos varan con frecuencia dependiendo del lugar de dnde se surte la receta y alguna farmacias pueden ofrecer precios ms baratos.  El sitio web www.goodrx.com tiene cupones para medicamentos de diferentes farmacias. Los precios aqu no tienen en cuenta lo que podra costar con la ayuda del seguro (puede ser ms barato con su seguro), pero el sitio web puede darle el precio si no utiliz ningn seguro.  - Puede imprimir el cupn correspondiente y llevarlo con su receta a la farmacia.  - Tambin puede pasar por nuestra oficina durante el horario de atencin regular y recoger una tarjeta de cupones de GoodRx.  - Si necesita que su receta se enve electrnicamente a una farmacia diferente, informe a nuestra oficina a travs de MyChart de [place]   o por telfono llamando al [phone] y presione la opcin 4.  

## 2022-11-01 ENCOUNTER — Encounter: Payer: Self-pay | Admitting: Physical Therapy

## 2022-11-01 ENCOUNTER — Ambulatory Visit: Payer: Medicare Other | Attending: Family Medicine | Admitting: Physical Therapy

## 2022-11-01 DIAGNOSIS — R262 Difficulty in walking, not elsewhere classified: Secondary | ICD-10-CM | POA: Diagnosis present

## 2022-11-01 DIAGNOSIS — M5459 Other low back pain: Secondary | ICD-10-CM

## 2022-11-01 DIAGNOSIS — M5432 Sciatica, left side: Secondary | ICD-10-CM

## 2022-11-01 NOTE — Therapy (Signed)
OUTPATIENT PHYSICAL THERAPY EVALUATION   Patient Name: Kristen Singh MRN: [id number] DOB:11/05/1940, 82 y.o., female Today's Date: 11/01/2022  END OF SESSION:  PT End of Session - 11/01/22 2052     Visit Number 1    Number of Visits 24    Date for PT Re-Evaluation 01/24/23    Authorization Type MEDICARE PART B reporting from 11/01/2022    Progress Note Due on Visit 10    PT Start Time 1350    PT Stop Time 1430    PT Time Calculation (min) 40 min    Activity Tolerance Patient tolerated treatment well    Behavior During Therapy [facility] for tasks assessed/performed             Past Medical History:  Diagnosis Date   Anxiety    Arthritis    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   Atypical mole 03/29/2017   right medial shoulder/mild   Basal cell carcinoma 04/10/2019   left lower pretibial   Carpal tunnel syndrome    Cortical cataract    DDD (degenerative disc disease), cervical    DDD (degenerative disc disease), lumbar    Depression    DOE (dyspnea on exertion)    GERD (gastroesophageal reflux disease)    Headache    History of kidney stones    HLD (hyperlipidemia)    Hypertension    Migraines    Osteopenia    Past Surgical History:  Procedure Laterality Date   BREAST BIOPSY Right 02/21/2020   affirm bx calcs, x marker, mild stromal fibrosis, benign, no atypia   BREAST BIOPSY Right 04/06/2021   right breast UIQ calcs, coil marker --> path showed COLUMNAR CELL CHANGE, FIBROADENOMATOID CHANGES, SCLEROSING ADENOSIS, LOBULAR NEOPLASIA   BREAST BIOPSY Right 04/06/2021   UIQ calcs, ribbon marker --> path showed FOCAL ATYPICAL DUCTAL HYPERPLASIA (ADH) IN A BACKGROUND OF COLUMNAR CELL CHANGE WITH USUAL DUCTAL HYPERPLASIA, APOCRINE METAPLASIA, SCLEROSING ADENOSIS, LOBULAR NEOPLASIA   CATARACT EXTRACTION, BILATERAL N/A    CHOLECYSTECTOMY     COLONOSCOPY     EXCISION OF BREAST BIOPSY Right 05/08/2021   Procedure: EXCISION OF BREAST BIOPSY W/ Needle  localization;  Surgeon: Herbert Pun, MD;  Location: [facility] ORS;  Service: General;  Laterality: Right;   TONSILLECTOMY     WISDOM TOOTH EXTRACTION     Patient Active Problem List   Diagnosis Date Noted   Breast neoplasm, Tis (DCIS), 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 She had this problem before but the pain went down the right side about 10 years ago, but not as bad. She reports she thinks the most recent episode of back pian started about a year ago and she mentioned it to her doctor in April. She states it gradually started getting harder for her to lift things and now she avoids lifting things and carrying things on the left side. The pain is intermittent. She states prednisone helped a lot, but it is coming back. Before prednisone she had to take tylenol 3x a day (the max) and now it is 1-2x a day. She finished the prednisone about 3 weeks ago.   PERTINENT HISTORY:  Patient is a 82 y.o. female who presents to outpatient physical therapy with a referral for medical diagnosis lumbar DDD (degenerative disc disease), lumbar radiculitis. This patient's chief complaints consist of intermittent low back pain with radiation to left glute, lateral knee, and calf leading to the following functional deficits: difficulty  with usual activities such as lifting, walking, visiting her son and daughter in law in [place], riding on the plane, logging/bouncing, walking up stairs, prolonged sitting, carrying, dressing. Relevant past medical history and comorbidities include anxiety/depression (see's therapist 1x a month, and working to get into a psychologist), GERD, HTN, HLD, migraines/headaches, osteopenia, basal cell carcinoma removed, CTS, arthritis, atypical ductal hyperplasia of right breast (surgically removed, did not do radiation/chemo), bilateral cataract surgery, cholecystectomy, hx of kidney stones, lumbar and cervical spine degenerative disc disease.  Patient denies hx of stroke, seizures, lung problems, heart problems, diabetes, unexplained weight loss, unexplained changes in bowel or bladder problems, unexplained stumbling or dropping things, and spinal surgery  PAIN:  Are you having pain? Yes: NPRS scale: Current: 0/10,  Best: 0/10, Worst: 8/10. Pain location: from left buttocks to lateral knee to left calf  Pain description: stabbing in buttocks, "pain the feels icy" in leg.  Aggravating factors: lifting, jogging/bouncing, walking up stairs, prolonged sitting, carrying,  Relieving factors: prednisone, tylenol, put feet up, stretch out on the couch.   FUNCTIONAL LIMITATIONS: difficulty with usual activities such as lifting, walking, visiting her son and daughter in law in [place], riding on the plane, logging/bouncing, walking up stairs, prolonged sitting, carrying, dressing.   PRECAUTIONS: None  WEIGHT BEARING RESTRICTIONS: No  FALLS:  Has patient fallen in last 6 months? No She is occasionally concerned about her balance.   LIVING ENVIRONMENT: Lives with: lives with their spouse Lives in: one level home with a level entry.  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   LEISURE: playing on computer  PLOF: Independent  PATIENT GOALS: "to find the  things I can do to help myself feel better and the things I should avoid doing"   NEXT MD VISIT:  Early February  OBJECTIVE  DIAGNOSTIC FINDINGS:  No recent imaging available, MRI from 2012 in [place].   SELF- REPORTED FUNCTION FOTO score: 51/100 (lumbar spine questionnaire)  OBSERVATION/INSPECTION Posture Posture (standing): wide stance, more weight on right until purposely standing with feet together, forward head, increased thoracic kypyhosis.  Anthropometrics Tremor: none Body composition: BMI 28.5 Muscle bulk: well developed quads Skin: WFL where visualized Functional Mobility Bed mobility: supine <> sit and rolling I  Transfers: sit <> stand I Gait: grossly WFL for household and short community ambulation. More detailed  gait analysis deferred to later date as needed.  SPINE MOTION  LUMBAR SPINE AROM *Indicates pain Flexion: fingers to floor, normal pull both thighs  Extension: 50% leans to the right Side Flexion:   R WNL   L WNL Rotation:  R WNL L WNL  PERIPHERAL JOINT MOTION (in degrees)  ACTIVE RANGE OF MOTION (AROM) B LE appears grossly WFL for basic mobility.   PASSIVE RANGE OF MOTION (PROM) B LE appears grossly WFL for basic mobility  MUSCLE PERFORMANCE (MMT):  *Indicates pain 11/01/22 Date Date  Joint/Motion R/L R/L R/L  Hip     Flexion (L1, L2) 4/4+ / /  Extension (knee ext) 5/4+ / /  Abduction 5/4 / /  Knee     Extension (L3) 5/5 / /  Flexion (S2) 4+/4+ / /  Ankle/Foot     Dorsiflexion (L4) 5/5 / /  Great toe extension (L5) 4/4 / /  Eversion (S1) 5/5 / /  Plantarflexion (S1) 4+/4+ / /  Comments:  11/01/2022: able to toe walk with B UE support, able to heel walk with no UE support. Slight momentary symptoms  on left side when toe walking.   SPECIAL TESTS:  LOWER LIMB NEURODYNAMIC TESTS Straight Leg Raise (Sciatic nerve)  R  = tension but negative for concordant pain  L  = tension but negative for concordant pain  HIP SPECIAL  TESTS FABER: R = negative, L = negative.  SIJ SPECIAL TESTS SIJ compression: R = negative, L = negative. Sacral thrust: R = negative, L = negative.  ACCESSORY MOTION: CPA along lumbar spine more uncomfortable at lowest levels. No glute pain.   PALPATION: TTP right glute med and concordant TTP left deep glute/hip ER location.   REPEATED MOTIONS TESTING: Prone press up 1x10 no change in concordant symptoms. Increased upper back pain (near T8).   TODAY'S TREATMENT:   PATIENT EDUCATION:  Education details:  Education on diagnosis, prognosis, POC, anatomy and physiology of current condition.  Reviewed cancelation/no-show policy with patient and confirmed patient has correct phone number for clinic; patient verbalized understanding (11/01/22). Person educated: Patient Education method: Customer service manager Education comprehension: verbalized understanding and needs further education  HOME EXERCISE PROGRAM: TBD  ASSESSMENT:  CLINICAL IMPRESSION: Patient is a 82 y.o. 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 Patient had bilateral neural tension without reproduction of symptoms and significant localized tenderness and tension in left deep hip musculature suggesting involvement of piriformis and muscles in this region. Patient demonstrated some unsteadiness on feet during exam and would benefit from further screening for balance.  Patient presents with significant pain, paresthesia, posture, muscle tension, motor control, work capacity, muscle performance (strength/power/endurance) and activity tolerance impairments that are limiting ability to complete her usual activities such as lifting, walking, visiting her son and daughter in law in [place], riding on the plane, logging/bouncing, walking up  stairs, prolonged sitting, carrying, dressing without difficulty. Patient will benefit from skilled physical therapy intervention to address current body structure impairments and activity limitations to improve function and work towards goals set in current POC in order to return to prior level of function or maximal functional improvement.    OBJECTIVE IMPAIRMENTS: decreased activity tolerance, decreased balance, decreased coordination, decreased endurance, decreased knowledge of condition, decreased mobility, difficulty walking, decreased ROM, decreased strength, hypomobility, impaired perceived functional ability, increased muscle  spasms, impaired flexibility, improper body mechanics, postural dysfunction, and pain.   ACTIVITY LIMITATIONS: carrying, lifting, sitting, squatting, stairs, and dressing  PARTICIPATION LIMITATIONS: cleaning, interpersonal relationship, community activity, and   usual activities such as lifting, walking, visiting her son and daughter in law in [place], riding on the plane, logging/bouncing, walking up stairs, prolonged sitting, carrying, dressing  PERSONAL FACTORS: Age, Past/current experiences, Time since onset of injury/illness/exacerbation, and 3+ comorbidities:   anxiety/depression (see's therapist 1x a month, and working to get into a psychologist), GERD, HTN, HLD, migraines/headaches, osteopenia, basal cell carcinoma removed, CTS, arthritis, atypical ductal hyperplasia of right breast (surgically removed, did not do radiation/chemo), bilateral cataract surgery, cholecystectomy, hx of kidney stones, lumbar and cervical spine degenerative disc disease are also affecting patient's functional outcome.   REHAB POTENTIAL: Good  CLINICAL DECISION MAKING: Stable/uncomplicated  EVALUATION COMPLEXITY: Low   GOALS: Goals reviewed with patient? No  SHORT TERM GOALS: Target date: 11/15/2022  Patient will be independent with initial home exercise program for  self-management of symptoms. Baseline: Initial HEP to be provided at visit 2 as appropriate (11/01/22); Goal status: INITIAL   LONG TERM GOALS: Target date: 01/24/2023  Patient will be independent with a long-term home exercise program for self-management of symptoms.  Baseline: Initial HEP to be provided at visit 2 as appropriate (11/01/22); Goal status: INITIAL  2.  Patient will demonstrate improved FOTO to equal or greater than 59 by visit #11 to demonstrate improvement in overall condition and self-reported functional ability.  Baseline: 51 (11/01/22); Goal status: INITIAL  3.  Patient will improve left hip strength to equal or grater than right hip strength to improve her ability to climb and descend stairs. Baseline: left weaker in hip flexion, extension, and abduction - see objective (11/01/22); Goal status: INITIAL  4.  Patient will report equal or less than 1/10 concordant pain during functional activity to improve her ability to return to traveling, walking, and navigating stairs.  Baseline: up to 8/10 (11/01/22); Goal status: INITIAL  5.  Patient will complete community, work and/or recreational activities without limitation due to current condition.  Baseline: difficulty with usual activities such as lifting, walking, visiting her son and daughter in law in [place], riding on the plane, logging/bouncing, walking up stairs, prolonged sitting, carrying, dressing (11/01/22); Goal status: INITIAL   PLAN:  PT FREQUENCY: 1-2x/week  PT DURATION: 12 weeks  PLANNED INTERVENTIONS: Therapeutic exercises, Therapeutic activity, Neuromuscular re-education, Balance training, Gait training, Patient/Family education, Self Care, Joint mobilization, Stair training, DME instructions, Dry Needling, Electrical stimulation, Spinal mobilization, Cryotherapy, Moist heat, Manual therapy, and Re-evaluation.  PLAN FOR NEXT SESSION: update HEP as appropriate, progressive core/LE/functional  strengthening as tolerated, manual therapy as needed, balance interventions as needed.   Everlean Alstrom. Graylon Good, PT, DPT 11/01/22, 9:15 PM  [facility] [address] [place], [place] [address] P: [phone] I F: [phone]

## 2022-11-03 ENCOUNTER — Telehealth: Payer: Self-pay

## 2022-11-03 ENCOUNTER — Ambulatory Visit: Payer: Medicare Other | Admitting: Physical Therapy

## 2022-11-03 NOTE — Telephone Encounter (Addendum)
Pt did not arrive for for scheduled PT visit, no message received. Author reached out at listed number, left confidential VM, encouraged to contact clinic for details on next visit.   Addendum: pt returned call, spoke to support rep, apologized for missing appointment, reports she forgot about her appointment.    2:13 PM, 11/03/22 Etta Grandchild, PT, DPT Physical Therapist - [place] [phone] (Office)

## 2022-11-08 ENCOUNTER — Ambulatory Visit: Payer: Medicare Other | Admitting: Physical Therapy

## 2022-11-08 ENCOUNTER — Encounter: Payer: Self-pay | Admitting: Physical Therapy

## 2022-11-08 VITALS — BP 103/57 | HR 79

## 2022-11-08 DIAGNOSIS — R262 Difficulty in walking, not elsewhere classified: Secondary | ICD-10-CM

## 2022-11-08 DIAGNOSIS — M5459 Other low back pain: Secondary | ICD-10-CM

## 2022-11-08 DIAGNOSIS — M5432 Sciatica, left side: Secondary | ICD-10-CM

## 2022-11-08 NOTE — Therapy (Signed)
OUTPATIENT PHYSICAL THERAPY TREATMENT NOTE   Patient Name: Kristen Singh MRN: [id number] DOB:04-30-1941, 82 y.o., female Today's Date: 11/08/2022  PCP: Cheryll Cockayne, MD  REFERRING PROVIDER: Allene Dillon, FNP   END OF SESSION:   PT End of Session - 11/08/22 1950     Visit Number 2    Number of Visits 24    Date for PT Re-Evaluation 01/24/23    Authorization Type MEDICARE PART B reporting from 11/01/2022    Progress Note Due on Visit 10    PT Start Time 1435    PT Stop Time 1515    PT Time Calculation (min) 40 min    Activity Tolerance Patient tolerated treatment well    Behavior During Therapy [facility] for tasks assessed/performed             Past Medical History:  Diagnosis Date   Anxiety    Arthritis    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   Atypical mole 03/29/2017   right medial shoulder/mild   Basal cell carcinoma 04/10/2019   left lower pretibial   Carpal tunnel syndrome    Cortical cataract    DDD (degenerative disc disease), cervical    DDD (degenerative disc disease), lumbar    Depression    DOE (dyspnea on exertion)    GERD (gastroesophageal reflux disease)    Headache    History of kidney stones    HLD (hyperlipidemia)    Hypertension    Migraines    Osteopenia    Past Surgical History:  Procedure Laterality Date   BREAST BIOPSY Right 02/21/2020   affirm bx calcs, x marker, mild stromal fibrosis, benign, no atypia   BREAST BIOPSY Right 04/06/2021   right breast UIQ calcs, coil marker --> path showed COLUMNAR CELL CHANGE, FIBROADENOMATOID CHANGES, SCLEROSING ADENOSIS, LOBULAR NEOPLASIA   BREAST BIOPSY Right 04/06/2021   UIQ calcs, ribbon marker --> path showed FOCAL ATYPICAL DUCTAL HYPERPLASIA (ADH) IN A BACKGROUND OF COLUMNAR CELL CHANGE WITH USUAL DUCTAL HYPERPLASIA, APOCRINE METAPLASIA, SCLEROSING ADENOSIS, LOBULAR NEOPLASIA   CATARACT EXTRACTION, BILATERAL N/A    CHOLECYSTECTOMY     COLONOSCOPY     EXCISION OF  BREAST BIOPSY Right 05/08/2021   Procedure: EXCISION OF BREAST BIOPSY W/ Needle localization;  Surgeon: Herbert Pun, MD;  Location: [facility] ORS;  Service: General;  Laterality: Right;   TONSILLECTOMY     WISDOM TOOTH EXTRACTION     Patient Active Problem List   Diagnosis Date Noted   Breast neoplasm, Tis (DCIS), 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 female who presents to outpatient physical therapy with a referral for medical diagnosis lumbar DDD (degenerative disc disease), lumbar radiculitis. This patient's chief complaints consist of intermittent low back pain with radiation to left glute, lateral knee, and calf leading to the following functional deficits: difficulty with usual activities such as lifting, walking, visiting her son and daughter in law in [place], riding on the plane, logging/bouncing, walking up stairs, prolonged sitting, carrying, dressing. Relevant past medical history and comorbidities include anxiety/depression (see's therapist 1x a month, and working to get into a psychologist), GERD, HTN, HLD, migraines/headaches, osteopenia, basal cell carcinoma removed, CTS, arthritis, atypical ductal hyperplasia of right breast (surgically removed, did not do radiation/chemo), bilateral cataract  surgery, cholecystectomy, hx of kidney stones, lumbar and cervical spine degenerative disc disease.  Patient denies hx of stroke, seizures, lung problems, heart problems, diabetes, unexplained weight loss, unexplained changes in bowel or bladder problems, unexplained stumbling or dropping things, and spinal surgery   PRECAUTIONS: fall risk  SUBJECTIVE:                                                                                                                                                                                       SUBJECTIVE STATEMENT:  Patient reports she had pain in both calves when she got up this morning and she is been getting pain in her mid back near the bra strap when she has been sitting or laying in the bed too long. She forgot her last PT appointment after being tired from another appointment earlier in the day. She was not sore after her PT eval.    PAIN:  Are you having pain? NPRS 0/10   OBJECTIVE:   Vitals:   11/08/22 1445  BP: (!) 103/57  Pulse: 79  SpO2: 94%     TODAY'S TREATMENT:   Therapeutic exercise: to centralize symptoms and improve ROM, strength, muscular endurance, and activity tolerance required for successful completion of functional activities.  - vitals measurement for baseline before exercise (see above).  Superset:  - seated thoracic extension  stretch with towel roll in chair, 3x1 min of repetitions at self selected pace.  - squat to touch chair/sit to stand to overhead press with 1/2# DB in each hand. 1x8, 2x1 min at self selected pace.   - seated sciatic neural tensioner, 1x1 min each side self selected pace.  - seated sciatic neural slider, 1x1 min each side self selected pace.   Pt required multimodal cuing for proper technique and to facilitate improved neuromuscular control, strength, range of motion, and functional ability resulting in improved performance and form.  PATIENT EDUCATION:  Education details:  Exercise purpose/form. Self management techniques.  Education on HEP including handout  Reviewed cancelation/no-show policy with patient and confirmed patient has correct phone number for clinic; patient verbalized understanding (11/01/22). Person educated: Patient Education method: Explanation, verbal/tactile cuing, and Demonstration Education comprehension: verbalized understanding and needs further education   HOME EXERCISE PROGRAM: Access  Code: [email or web address] URL: [email or web address] Date: 11/08/2022 Prepared by: Rosita Kea  Exercises - Seated Slump Nerve Glide  - 1 x daily - 1 sets - 1 minute time  [place] [[email or web address]] view at [email or web address]" using code: [email or web address]  SQUAT OVERHEAD PRESS - DUMBBELL -  Complete 3 Sets, Perform 1 Times a Day  thoracic extension stretch -  Hold 5 Seconds, Complete 3 Sets,  Perform 1 Times a Day  ASSESSMENT:   CLINICAL IMPRESSION: Patient arrives with no assistive device reporting no pain upon arrival but continued difficulty due to pain near the thoracic spine. Today's session focused on updating HEP and initiating functional strengthening, pain relief for mid back, and nerve glides. Patient required significant cuing to learn exercises and had some difficulty with carry over to 2nd set and with HEP handouts. She would benefit from reinforcement of education and HEP at next session. Patient fatigued quickly and had difficulty rising from the chair during squat to overhead press. She reported increased achiness in her left LE by end of session but seemed to feel it as an acceptable response. She was educated about DOMS. Patient would benefit from continued management of limiting condition by skilled physical therapist to address remaining impairments and functional limitations to work towards stated goals and return to PLOF or maximal functional independence.   From PT Eval on 11/01/2022:  Patient is a 82 y.o. 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 Patient had bilateral neural tension without reproduction of symptoms and significant localized tenderness and tension in left deep hip musculature suggesting involvement of piriformis and muscles in this region. Patient demonstrated some  unsteadiness on feet during exam and would benefit from further screening for balance.  Patient presents with significant pain, paresthesia, posture, muscle tension, motor control, work capacity, muscle performance (strength/power/endurance) and activity tolerance impairments that are limiting ability to complete her usual activities such as lifting, walking, visiting her son and daughter in law in [place], riding on the plane, logging/bouncing, walking up stairs, prolonged sitting, carrying, dressing without difficulty. Patient will benefit from skilled physical therapy intervention to address current body structure impairments and activity limitations to improve function and work towards goals set in current POC in order to return to prior level of function or maximal functional improvement.      OBJECTIVE IMPAIRMENTS: decreased activity tolerance, decreased balance, decreased coordination, decreased endurance, decreased knowledge of condition, decreased mobility, difficulty walking, decreased ROM, decreased strength, hypomobility, impaired perceived functional ability, increased muscle spasms, impaired flexibility, improper body mechanics, postural dysfunction, and pain.    ACTIVITY LIMITATIONS: carrying, lifting, sitting, squatting, stairs, and dressing   PARTICIPATION LIMITATIONS: cleaning, interpersonal relationship, community activity, and   usual activities such as lifting, walking, visiting her son and daughter in law in [place], riding on the plane, logging/bouncing, walking up stairs, prolonged sitting, carrying, dressing   PERSONAL FACTORS: Age, Past/current experiences, Time since onset of injury/illness/exacerbation, and 3+ comorbidities:   anxiety/depression (see's therapist 1x a month, and working to get into a psychologist), GERD, HTN, HLD, migraines/headaches, osteopenia, basal cell carcinoma removed, CTS, arthritis, atypical ductal hyperplasia of right breast (surgically removed, did not  do radiation/chemo), bilateral cataract surgery, cholecystectomy, hx of kidney stones, lumbar and cervical spine degenerative disc disease are also affecting patient's functional outcome.    REHAB POTENTIAL: Good   CLINICAL DECISION MAKING: Stable/uncomplicated   EVALUATION COMPLEXITY: Low     GOALS: Goals reviewed with patient? No   SHORT TERM GOALS: Target date: 11/15/2022   Patient will be independent with initial home exercise program for self-management of symptoms. Baseline: Initial HEP to be provided at visit 2 as appropriate (11/01/22); initial HEP provided at visit 2 (11/08/2022); Goal status: In-progress     LONG TERM GOALS: Target date:  01/24/2023   Patient will be independent with a long-term home exercise program for self-management of symptoms.  Baseline: Initial HEP to be provided at visit 2 as appropriate (11/01/22);initial HEP provided at visit 2 (11/08/2022); Goal status: In-progress   2.  Patient will demonstrate improved FOTO to equal or greater than 59 by visit #11 to demonstrate improvement in overall condition and self-reported functional ability.  Baseline: 51 (11/01/22); Goal status: In-progress   3.  Patient will improve left hip strength to equal or grater than right hip strength to improve her ability to climb and descend stairs. Baseline: left weaker in hip flexion, extension, and abduction - see objective (11/01/22); Goal status: In-progress   4.  Patient will report equal or less than 1/10 concordant pain during functional activity to improve her ability to return to traveling, walking, and navigating stairs.  Baseline: up to 8/10 (11/01/22); Goal status: In-progress   5.  Patient will complete community, work and/or recreational activities without limitation due to current condition.  Baseline: difficulty with usual activities such as lifting, walking, visiting her son and daughter in law in [place], riding on the plane, jogging/bouncing, walking up  stairs, prolonged sitting, carrying, dressing (11/01/22); Goal status: In-progress     PLAN:   PT FREQUENCY: 1-2x/week   PT DURATION: 12 weeks   PLANNED INTERVENTIONS: Therapeutic exercises, Therapeutic activity, Neuromuscular re-education, Balance training, Gait training, Patient/Family education, Self Care, Joint mobilization, Stair training, DME instructions, Dry Needling, Electrical stimulation, Spinal mobilization, Cryotherapy, Moist heat, Manual therapy, and Re-evaluation.   PLAN FOR NEXT SESSION: update HEP as appropriate, progressive core/LE/functional strengthening as tolerated, manual therapy as needed, balance interventions as needed.    Nancy Nordmann, PT, DPT 11/08/2022, 7:51 PM  [facility] [address] [place], [place] [address] P: [phone] I F: [phone]

## 2022-11-10 ENCOUNTER — Ambulatory Visit: Payer: Medicare Other | Admitting: Physical Therapy

## 2022-11-10 ENCOUNTER — Encounter: Payer: Self-pay | Admitting: Physical Therapy

## 2022-11-10 DIAGNOSIS — M5432 Sciatica, left side: Secondary | ICD-10-CM

## 2022-11-10 DIAGNOSIS — R262 Difficulty in walking, not elsewhere classified: Secondary | ICD-10-CM

## 2022-11-10 DIAGNOSIS — M5459 Other low back pain: Secondary | ICD-10-CM

## 2022-11-10 NOTE — Therapy (Signed)
OUTPATIENT PHYSICAL THERAPY TREATMENT NOTE   Patient Name: Kristen Singh MRN: [id number] DOB:10-Nov-1940, 82 y.o., female Today's Date: 11/10/2022  PCP: Cheryll Cockayne, MD  REFERRING PROVIDER: Allene Dillon, FNP   END OF SESSION:   PT End of Session - 11/10/22 1357     Visit Number 3    Number of Visits 24    Date for PT Re-Evaluation 01/24/23    Authorization Type MEDICARE PART B reporting from 11/01/2022    Progress Note Due on Visit 10    PT Start Time 1347    PT Stop Time 1427    PT Time Calculation (min) 40 min    Activity Tolerance Patient tolerated treatment well    Behavior During Therapy [facility] for tasks assessed/performed              Past Medical History:  Diagnosis Date   Anxiety    Arthritis    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   Atypical mole 03/29/2017   right medial shoulder/mild   Basal cell carcinoma 04/10/2019   left lower pretibial   Carpal tunnel syndrome    Cortical cataract    DDD (degenerative disc disease), cervical    DDD (degenerative disc disease), lumbar    Depression    DOE (dyspnea on exertion)    GERD (gastroesophageal reflux disease)    Headache    History of kidney stones    HLD (hyperlipidemia)    Hypertension    Migraines    Osteopenia    Past Surgical History:  Procedure Laterality Date   BREAST BIOPSY Right 02/21/2020   affirm bx calcs, x marker, mild stromal fibrosis, benign, no atypia   BREAST BIOPSY Right 04/06/2021   right breast UIQ calcs, coil marker --> path showed COLUMNAR CELL CHANGE, FIBROADENOMATOID CHANGES, SCLEROSING ADENOSIS, LOBULAR NEOPLASIA   BREAST BIOPSY Right 04/06/2021   UIQ calcs, ribbon marker --> path showed FOCAL ATYPICAL DUCTAL HYPERPLASIA (ADH) IN A BACKGROUND OF COLUMNAR CELL CHANGE WITH USUAL DUCTAL HYPERPLASIA, APOCRINE METAPLASIA, SCLEROSING ADENOSIS, LOBULAR NEOPLASIA   CATARACT EXTRACTION, BILATERAL N/A    CHOLECYSTECTOMY     COLONOSCOPY     EXCISION OF  BREAST BIOPSY Right 05/08/2021   Procedure: EXCISION OF BREAST BIOPSY W/ Needle localization;  Surgeon: Herbert Pun, MD;  Location: [facility] ORS;  Service: General;  Laterality: Right;   TONSILLECTOMY     WISDOM TOOTH EXTRACTION     Patient Active Problem List   Diagnosis Date Noted   Breast neoplasm, Tis (DCIS), 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 female who presents to outpatient physical therapy with a referral for medical diagnosis lumbar DDD (degenerative disc disease), lumbar radiculitis. This patient's chief complaints consist of intermittent low back pain with radiation to left glute, lateral knee, and calf leading to the following functional deficits: difficulty with usual activities such as lifting, walking, visiting her son and daughter in law in [place], riding on the plane, logging/bouncing, walking up stairs, prolonged sitting, carrying, dressing. Relevant past medical history and comorbidities include anxiety/depression (see's therapist 1x a month, and working to get into a psychologist), GERD, HTN, HLD, migraines/headaches, osteopenia, basal cell carcinoma removed, CTS, arthritis, atypical ductal hyperplasia of right breast (surgically removed, did not do radiation/chemo), bilateral  cataract surgery, cholecystectomy, hx of kidney stones, lumbar and cervical spine degenerative disc disease.  Patient denies hx of stroke, seizures, lung problems, heart problems, diabetes, unexplained weight loss, unexplained changes in bowel or bladder problems, unexplained stumbling or dropping things, and spinal surgery   PRECAUTIONS: fall risk  SUBJECTIVE:                                                                                                                                                                                       SUBJECTIVE STATEMENT:  Patient reports she was sore after last PT session that she is still getting over it. She was very sore in her quads. She states her weights are 2.5 lbs each. She states she did not perform her HEP yesterday because she was so sore in her quads.    PAIN:  Are you having pain? NPRS 2/10 in left LE to ankle.    OBJECTIVE:     TODAY'S TREATMENT:   Therapeutic exercise: to centralize symptoms and improve ROM, strength, muscular endurance, and activity tolerance required for successful completion of functional activities.  - NuStep level 4 using bilateral upper and lower extremities. Seat/handle setting 6/10. For improved extremity mobility, muscular endurance, and activity tolerance; and to induce the analgesic effect of aerobic exercise, stimulate improved joint nutrition, and prepare body structures and systems for following interventions. x 6  minutes. Average SPM = 63. Needed assistance with set up.   [facility]):  - seated thoracic extension  stretch with towel roll in chair, 3x1 min of repetitions at self selected pace.  - squat to touch chair/sit to stand to overhead press with 2# DB in each hand. 3x1 min at self selected pace.  - seated sciatic neural slider, 1x30 seconds each side self selected pace.  Required a lot of cuing at first and moderate cuing afterwards.   - prone press up, 1x10. (no change in leg symptoms)  - half hooklying curl up, 5x20 seconds. (No pillow to improve posture)  - Education on HEP including handout   Manual therapy: to reduce pain and tissue tension, improve range of motion, neuromodulation, in order to promote improved ability to complete functional activities. PRONE - STM to left glute and deep posterior hip rotators focusing on tender portions near piriformis and QF. With and without passive L hip ER/IR while applying  sustained pressure over piriformis.  - CPA and L UPA grade III over lower lumbar spine (not symptomatic).   Pt required multimodal cuing for proper technique and to facilitate improved neuromuscular control, strength, range of motion, and functional ability resulting in improved performance and form.  PATIENT EDUCATION:  Education  details:  Exercise purpose/form. Self management techniques.  Education on HEP including handout  Reviewed cancelation/no-show policy with patient and confirmed patient has correct phone number for clinic; patient verbalized understanding (11/01/22). Person educated: Patient Education method: Explanation, verbal/tactile cuing, and Demonstration Education comprehension: verbalized understanding and needs further education   HOME EXERCISE PROGRAM: Access Code: [email or web address] URL: [email or web address] Date: 11/10/2022 Prepared by: Rosita Kea  Exercises - Seated Slump Nerve Glide  - 1 x daily - 1 sets - 1 minute time - Neutral Curl Up with Arms Across Chest  - 1 x daily - 5 sets - 20 seconds hold  [place] [[email or web address]] view at [email or web address]" using code: [email or web address]  SQUAT OVERHEAD PRESS - DUMBBELL -  Complete 3 Sets, Perform 1 Times a Day  thoracic extension stretch -  Hold 5 Seconds, Complete 3 Sets, Perform 1 Times a Day  ASSESSMENT:   CLINICAL IMPRESSION: Patient arrives to PT with apparent significant DOMS in B quads following last PT session. She also complains of pain/symptoms down L LE to lower leg. Session focused on reviewing HEP and exercises for spine mobility, functional/core/hip strength, and introduced curl up for improved anterior core strength and endurance. Patient required a max cuing for exercises from HEP at first with improved carry over by third set. Squat exercise was regressed to sit to stand due to DOMS in quads making it harder to perform with quality movement today. Patient had difficulty holding head off the  mat and maintaining strong posterior pelvic tilt and abdominal brace with attempt to progress beyond curl up. She was educated about DOMS and appropriate response. She was also educated about possible DOMS at anterior neck muscles due to strong work she felt there during curl up.Patient reported feeling about the same at the end of the session as when she arrived with no increase in pain. Patient would benefit from continued management of limiting condition by skilled physical therapist to address remaining impairments and functional limitations to work towards stated goals and return to PLOF or maximal functional independence.   From PT Eval on 11/01/2022:  Patient is a 82 y.o. 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 Patient had bilateral neural tension without reproduction of symptoms and significant localized tenderness and tension in left deep hip musculature suggesting involvement of piriformis and muscles in this region. Patient demonstrated some unsteadiness on feet during exam and would benefit from further screening for balance.  Patient presents with significant pain, paresthesia, posture, muscle tension, motor control, work capacity, muscle performance (strength/power/endurance) and activity tolerance impairments that are limiting ability to complete her usual activities such as lifting, walking, visiting her son and daughter in law in [place], riding on the plane, logging/bouncing, walking up stairs, prolonged sitting, carrying, dressing without difficulty. Patient will benefit from skilled physical therapy intervention to address current body structure impairments and activity limitations to improve function and work towards goals set in current POC in order to return to prior level of function or maximal  functional improvement.      OBJECTIVE IMPAIRMENTS: decreased activity tolerance, decreased balance, decreased coordination, decreased endurance, decreased knowledge of condition, decreased mobility, difficulty walking, decreased ROM, decreased strength, hypomobility, impaired perceived functional ability, increased muscle spasms, impaired flexibility, improper body mechanics, postural dysfunction, and pain.    ACTIVITY LIMITATIONS: carrying, lifting, sitting, squatting, stairs, and dressing  PARTICIPATION LIMITATIONS: cleaning, interpersonal relationship, community activity, and   usual activities such as lifting, walking, visiting her son and daughter in law in [place], riding on the plane, logging/bouncing, walking up stairs, prolonged sitting, carrying, dressing   PERSONAL FACTORS: Age, Past/current experiences, Time since onset of injury/illness/exacerbation, and 3+ comorbidities:   anxiety/depression (see's therapist 1x a month, and working to get into a psychologist), GERD, HTN, HLD, migraines/headaches, osteopenia, basal cell carcinoma removed, CTS, arthritis, atypical ductal hyperplasia of right breast (surgically removed, did not do radiation/chemo), bilateral cataract surgery, cholecystectomy, hx of kidney stones, lumbar and cervical spine degenerative disc disease are also affecting patient's functional outcome.    REHAB POTENTIAL: Good   CLINICAL DECISION MAKING: Stable/uncomplicated   EVALUATION COMPLEXITY: Low     GOALS: Goals reviewed with patient? No   SHORT TERM GOALS: Target date: 11/15/2022   Patient will be independent with initial home exercise program for self-management of symptoms. Baseline: Initial HEP to be provided at visit 2 as appropriate (11/01/22); initial HEP provided at visit 2 (11/08/2022); Goal status: In-progress     LONG TERM GOALS: Target date: 01/24/2023   Patient will be independent with a long-term home exercise program for self-management of  symptoms.  Baseline: Initial HEP to be provided at visit 2 as appropriate (11/01/22);initial HEP provided at visit 2 (11/08/2022); Goal status: In-progress   2.  Patient will demonstrate improved FOTO to equal or greater than 59 by visit #11 to demonstrate improvement in overall condition and self-reported functional ability.  Baseline: 51 (11/01/22); Goal status: In-progress   3.  Patient will improve left hip strength to equal or grater than right hip strength to improve her ability to climb and descend stairs. Baseline: left weaker in hip flexion, extension, and abduction - see objective (11/01/22); Goal status: In-progress   4.  Patient will report equal or less than 1/10 concordant pain during functional activity to improve her ability to return to traveling, walking, and navigating stairs.  Baseline: up to 8/10 (11/01/22); Goal status: In-progress   5.  Patient will complete community, work and/or recreational activities without limitation due to current condition.  Baseline: difficulty with usual activities such as lifting, walking, visiting her son and daughter in law in [place], riding on the plane, jogging/bouncing, walking up stairs, prolonged sitting, carrying, dressing (11/01/22); Goal status: In-progress     PLAN:   PT FREQUENCY: 1-2x/week   PT DURATION: 12 weeks   PLANNED INTERVENTIONS: Therapeutic exercises, Therapeutic activity, Neuromuscular re-education, Balance training, Gait training, Patient/Family education, Self Care, Joint mobilization, Stair training, DME instructions, Dry Needling, Electrical stimulation, Spinal mobilization, Cryotherapy, Moist heat, Manual therapy, and Re-evaluation.   PLAN FOR NEXT SESSION: update HEP as appropriate, progressive core/LE/functional strengthening as tolerated, manual therapy as needed, balance interventions as needed.    Nancy Nordmann, PT, DPT 11/10/2022, 2:51 PM  [facility] [address] [place],  [address] P: [phone] I F: [phone]

## 2022-11-15 ENCOUNTER — Encounter: Payer: Self-pay | Admitting: Physical Therapy

## 2022-11-15 ENCOUNTER — Ambulatory Visit: Payer: Medicare Other | Admitting: Physical Therapy

## 2022-11-15 DIAGNOSIS — M5432 Sciatica, left side: Secondary | ICD-10-CM

## 2022-11-15 DIAGNOSIS — M5459 Other low back pain: Secondary | ICD-10-CM

## 2022-11-15 DIAGNOSIS — R262 Difficulty in walking, not elsewhere classified: Secondary | ICD-10-CM

## 2022-11-15 NOTE — Therapy (Signed)
OUTPATIENT PHYSICAL THERAPY TREATMENT NOTE   Patient Name: Kristen Singh MRN: [id number] DOB:23-Nov-1940, 82 y.o., female Today's Date: 11/15/2022  PCP: Cheryll Cockayne, MD  REFERRING PROVIDER: Allene Dillon, FNP   END OF SESSION:   PT End of Session - 11/15/22 1505     Visit Number 4    Number of Visits 24    Date for PT Re-Evaluation 01/24/23    Authorization Type MEDICARE PART B reporting from 11/01/2022    Progress Note Due on Visit 10    PT Start Time 1432    PT Stop Time 1522    PT Time Calculation (min) 50 min    Activity Tolerance Patient tolerated treatment well    Behavior During Therapy [facility] for tasks assessed/performed              Past Medical History:  Diagnosis Date   Anxiety    Arthritis    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   Atypical mole 03/29/2017   right medial shoulder/mild   Basal cell carcinoma 04/10/2019   left lower pretibial   Carpal tunnel syndrome    Cortical cataract    DDD (degenerative disc disease), cervical    DDD (degenerative disc disease), lumbar    Depression    DOE (dyspnea on exertion)    GERD (gastroesophageal reflux disease)    Headache    History of kidney stones    HLD (hyperlipidemia)    Hypertension    Migraines    Osteopenia    Past Surgical History:  Procedure Laterality Date   BREAST BIOPSY Right 02/21/2020   affirm bx calcs, x marker, mild stromal fibrosis, benign, no atypia   BREAST BIOPSY Right 04/06/2021   right breast UIQ calcs, coil marker --> path showed COLUMNAR CELL CHANGE, FIBROADENOMATOID CHANGES, SCLEROSING ADENOSIS, LOBULAR NEOPLASIA   BREAST BIOPSY Right 04/06/2021   UIQ calcs, ribbon marker --> path showed FOCAL ATYPICAL DUCTAL HYPERPLASIA (ADH) IN A BACKGROUND OF COLUMNAR CELL CHANGE WITH USUAL DUCTAL HYPERPLASIA, APOCRINE METAPLASIA, SCLEROSING ADENOSIS, LOBULAR NEOPLASIA   CATARACT EXTRACTION, BILATERAL N/A    CHOLECYSTECTOMY     COLONOSCOPY     EXCISION OF  BREAST BIOPSY Right 05/08/2021   Procedure: EXCISION OF BREAST BIOPSY W/ Needle localization;  Surgeon: Herbert Pun, MD;  Location: [facility] ORS;  Service: General;  Laterality: Right;   TONSILLECTOMY     WISDOM TOOTH EXTRACTION     Patient Active Problem List   Diagnosis Date Noted   Breast neoplasm, Tis (DCIS), 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 female who presents to outpatient physical therapy with a referral for medical diagnosis lumbar DDD (degenerative disc disease), lumbar radiculitis. This patient's chief complaints consist of intermittent low back pain with radiation to left glute, lateral knee, and calf leading to the following functional deficits: difficulty with usual activities such as lifting, walking, visiting her son and daughter in law in [place], riding on the plane, logging/bouncing, walking up stairs, prolonged sitting, carrying, dressing. Relevant past medical history and comorbidities include anxiety/depression (see's therapist 1x a month, and working to get into a psychologist), GERD, HTN, HLD, migraines/headaches, osteopenia, basal cell carcinoma removed, CTS, arthritis, atypical ductal hyperplasia of right breast (surgically removed, did not do radiation/chemo), bilateral  cataract surgery, cholecystectomy, hx of kidney stones, lumbar and cervical spine degenerative disc disease.  Patient denies hx of stroke, seizures, lung problems, heart problems, diabetes, unexplained weight loss, unexplained changes in bowel or bladder problems, unexplained stumbling or dropping things, and spinal surgery   PRECAUTIONS: fall risk  SUBJECTIVE:                                                                                                                                                                                       SUBJECTIVE STATEMENT:  Patient reports she is feeling pretty good. She states her pain continues to be intermittent. She said she continued to be sore in bilateral quads but all the way down to the lower legs L > R. She states her left leg is not hurting as much except after she got up the bar stool (which is where she eats). She is unsure if it is every time. She wants to know if she should start riding her recumbent bike at home. She states she saw Allene Dillon, FNP today and she set her up for an MRI and let her know she could have some prednisone if travels to [place] or [place]. She states she did not perform any of her HEP because she lost her paper and described her home/HEP as "a mess."  She is interested in chair yoga and using her exercise bike. She has done a mixture of yoga and piliates in the past. [employer] is the most convenient place for her to go.    PAIN:  Are you having pain? NPRS 1/10 in left LE lower let "doing pretty good really"    OBJECTIVE:     TODAY'S TREATMENT:   Therapeutic exercise: to centralize symptoms and improve ROM, strength, muscular endurance, and activity tolerance required for successful completion of functional activities.  - NuStep level 5 using bilateral upper and lower extremities. Seat/handle setting 6/10. For improved extremity mobility, muscular endurance, and activity tolerance; and to induce the analgesic effect of aerobic exercise, stimulate improved joint nutrition, and prepare body structures and systems for following interventions. x 6  minutes. Average SPM = 51. Needed assistance with set up.   [employer] [facility]):  - seated thoracic extension  stretch with towel roll in chair, 3x1 min of repetitions at self selected pace. (Last set increased concordant left LE pain) - squat to touch chair/sit to stand to overhead press with 2# DB in  each hand. 3x1 min at self selected pace.  - seated sciatic neural slider, 1x30 seconds each side self selected pace.  Required moderate cuing at first and less for second two sets  - seated lumbar  flexion roll out with theraball, 1x2 min   - Education on HEP including handout   Modality: (unbilled) Dry needling performed to lumbar spine to decrease pain and spasms along patient's left LE region with patient in prone utilizing 2 dry needle(s) .65m x 335mwith 3 sticks at left at L4-L5 multifidi and 2 sticks at right L4-L5 multifidi. Patient educated about the risks and benefits from therapy and verbally consents to treatment.  Dry needling performed by SaEverlean AlstromSnGraylon GoodT, DPT who is certified in this technique.   Manual therapy: to reduce pain and tissue tension, improve range of motion, neuromodulation, in order to promote improved ability to complete functional activities. PRONE - STM to lumbar paraspinals, and left glute and deep hip rotator region (reports more diffuse tenderness in glute region compared to last session, and more discomfort at left L5-S1 level).  - CPA from upper thoracic spine through lumbar spine. Reproduction of left LE paresthesia with CPA at approx T9, tender and hypomobile throughout thoracic spine.   Pt required multimodal cuing for proper technique and to facilitate improved neuromuscular control, strength, range of motion, and functional ability resulting in improved performance and form.  PATIENT EDUCATION:  Education details:  Exercise purpose/form. Self management techniques.  Education on HEP including handout. dry needling.  Reviewed cancelation/no-show policy with patient and confirmed patient has correct phone number for clinic; patient verbalized understanding (11/01/22). Person educated: Patient Education method: Explanation, verbal/tactile cuing, and Demonstration Education comprehension: verbalized understanding and needs further education   HOME  EXERCISE PROGRAM: Access Code: [email or web address]: [email or web address] Date: 11/15/2022 Prepared by: SaRosita KeaExercises - Seated Slump Nerve Glide  - 1 x daily - 1 sets - 1 minute time - Neutral Curl Up with Arms Across Chest  - 1 x daily - 5 sets - 20 seconds hold - Recumbent Bike  - 5 x weekly - 15-30 min time  [email or web address]] view at [email or web address]" using code: [email or web address]  SQUAT OVERHEAD PRESS - DUMBBELL -  Complete 3 Sets, Perform 1 Times a Day  thoracic extension stretch -  Hold 5 Seconds, Complete 3 Sets, Perform 1 Times a Day  ASSESSMENT:   CLINICAL IMPRESSION: Patient arrives to PT reporting low symptoms and without successful completion of HEP for second time after they were initially provided. Continued with performance of exercises included in part of HEP and patient continued to required significant cuing to perform correctly. She also was more vocal about increased symptoms/discomfort with increased L lower leg paresthesia with 3rd set of thoracic extension stretch and increased L LE discomfort with sit <> stand. She found seated lumbar flexion relaxing. Dry needling to the lowest segments of the lumbar spine followed by manual therapy performed after exercise interventions. L-sided muscles were more irritable and tight around the needle compared to right and patient reported most discomfort at left paraspinal region near L5. She also had reproduction of L lower leg paresthesia with CPA at approximately T9. She reported numb feeling in the left LE by end of session that she thought might be "better" than when she arrived, but is unclear to PT and could be a worsening of symptoms. Plan to further assess response to dry needling over the next three days at next PT visit and consider dry needling to the left deep hip rotators as appropriate. Patient would benefit from continued management of limiting condition by skilled physical therapist to address  remaining impairments and functional limitations to work towards stated goals and  return to PLOF or maximal functional independence.   From PT Eval on 11/01/2022:  Patient is a 82 y.o. 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 Patient had bilateral neural tension without reproduction of symptoms and significant localized tenderness and tension in left deep hip musculature suggesting involvement of piriformis and muscles in this region. Patient demonstrated some unsteadiness on feet during exam and would benefit from further screening for balance.  Patient presents with significant pain, paresthesia, posture, muscle tension, motor control, work capacity, muscle performance (strength/power/endurance) and activity tolerance impairments that are limiting ability to complete her usual activities such as lifting, walking, visiting her son and daughter in law in [place], riding on the plane, logging/bouncing, walking up stairs, prolonged sitting, carrying, dressing without difficulty. Patient will benefit from skilled physical therapy intervention to address current body structure impairments and activity limitations to improve function and work towards goals set in current POC in order to return to prior level of function or maximal functional improvement.      OBJECTIVE IMPAIRMENTS: decreased activity tolerance, decreased balance, decreased coordination, decreased endurance, decreased knowledge of condition, decreased mobility, difficulty walking, decreased ROM, decreased strength, hypomobility, impaired perceived functional ability, increased muscle spasms, impaired flexibility, improper body mechanics, postural dysfunction, and pain.    ACTIVITY LIMITATIONS: carrying, lifting, sitting, squatting, stairs, and dressing    PARTICIPATION LIMITATIONS: cleaning, interpersonal relationship, community activity, and   usual activities such as lifting, walking, visiting her son and daughter in law in [place], riding on the plane, logging/bouncing, walking up stairs, prolonged sitting, carrying, dressing   PERSONAL FACTORS: Age, Past/current experiences, Time since onset of injury/illness/exacerbation, and 3+ comorbidities:   anxiety/depression (see's therapist 1x a month, and working to get into a psychologist), GERD, HTN, HLD, migraines/headaches, osteopenia, basal cell carcinoma removed, CTS, arthritis, atypical ductal hyperplasia of right breast (surgically removed, did not do radiation/chemo), bilateral cataract surgery, cholecystectomy, hx of kidney stones, lumbar and cervical spine degenerative disc disease are also affecting patient's functional outcome.    REHAB POTENTIAL: Good   CLINICAL DECISION MAKING: Stable/uncomplicated   EVALUATION COMPLEXITY: Low     GOALS: Goals reviewed with patient? No   SHORT TERM GOALS: Target date: 11/15/2022   Patient will be independent with initial home exercise program for self-management of symptoms. Baseline: Initial HEP to be provided at visit 2 as appropriate (11/01/22); initial HEP provided at visit 2 (11/08/2022); Goal status: In-progress     LONG TERM GOALS: Target date: 01/24/2023   Patient will be independent with a long-term home exercise program for self-management of symptoms.  Baseline: Initial HEP to be provided at visit 2 as appropriate (11/01/22);initial HEP provided at visit 2 (11/08/2022); Goal status: In-progress   2.  Patient will demonstrate improved FOTO to equal or greater than 59 by visit #11 to demonstrate improvement in overall condition and self-reported functional ability.  Baseline: 51 (11/01/22); Goal status: In-progress   3.  Patient will improve left hip strength to equal or grater than right hip strength to improve her ability to climb and  descend stairs. Baseline: left weaker in hip flexion, extension, and abduction - see objective (11/01/22); Goal status: In-progress   4.  Patient will report equal or less than 1/10 concordant pain during functional activity to improve her ability to return to traveling, walking, and navigating stairs.  Baseline: up to  8/10 (11/01/22); Goal status: In-progress   5.  Patient will complete community, work and/or recreational activities without limitation due to current condition.  Baseline: difficulty with usual activities such as lifting, walking, visiting her son and daughter in law in [place], riding on the plane, jogging/bouncing, walking up stairs, prolonged sitting, carrying, dressing (11/01/22); Goal status: In-progress     PLAN:   PT FREQUENCY: 1-2x/week   PT DURATION: 12 weeks   PLANNED INTERVENTIONS: Therapeutic exercises, Therapeutic activity, Neuromuscular re-education, Balance training, Gait training, Patient/Family education, Self Care, Joint mobilization, Stair training, DME instructions, Dry Needling, Electrical stimulation, Spinal mobilization, Cryotherapy, Moist heat, Manual therapy, and Re-evaluation.   PLAN FOR NEXT SESSION: update HEP as appropriate, progressive core/LE/functional strengthening as tolerated, manual therapy as needed, balance interventions as needed.    Nancy Nordmann, PT, DPT 11/15/2022, 3:42 PM  [facility] [address] [place], [place] [address] P: [phone] I F: [phone]

## 2022-11-16 ENCOUNTER — Other Ambulatory Visit: Payer: Self-pay | Admitting: Family Medicine

## 2022-11-16 DIAGNOSIS — M5416 Radiculopathy, lumbar region: Secondary | ICD-10-CM

## 2022-11-18 ENCOUNTER — Encounter: Payer: Self-pay | Admitting: Physical Therapy

## 2022-11-18 ENCOUNTER — Ambulatory Visit: Payer: Medicare Other | Admitting: Physical Therapy

## 2022-11-18 DIAGNOSIS — M5432 Sciatica, left side: Secondary | ICD-10-CM

## 2022-11-18 DIAGNOSIS — R262 Difficulty in walking, not elsewhere classified: Secondary | ICD-10-CM

## 2022-11-18 DIAGNOSIS — M5459 Other low back pain: Secondary | ICD-10-CM | POA: Diagnosis not present

## 2022-11-18 NOTE — Therapy (Signed)
OUTPATIENT PHYSICAL THERAPY TREATMENT NOTE   Patient Name: Kristen Singh MRN: [id number] DOB:1941-05-05, 82 y.o., female Today's Date: 11/18/2022  PCP: Cheryll Cockayne, MD  REFERRING PROVIDER: Allene Dillon, FNP   END OF SESSION:   PT End of Session - 11/18/22 1438     Visit Number 5    Number of Visits 24    Date for PT Re-Evaluation 01/24/23    Authorization Type MEDICARE PART B reporting from 11/01/2022    Progress Note Due on Visit 10    PT Start Time 1437    PT Stop Time 1515    PT Time Calculation (min) 38 min    Activity Tolerance Patient tolerated treatment well    Behavior During Therapy [facility] for tasks assessed/performed               Past Medical History:  Diagnosis Date   Anxiety    Arthritis    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   Atypical mole 03/29/2017   right medial shoulder/mild   Basal cell carcinoma 04/10/2019   left lower pretibial   Carpal tunnel syndrome    Cortical cataract    DDD (degenerative disc disease), cervical    DDD (degenerative disc disease), lumbar    Depression    DOE (dyspnea on exertion)    GERD (gastroesophageal reflux disease)    Headache    History of kidney stones    HLD (hyperlipidemia)    Hypertension    Migraines    Osteopenia    Past Surgical History:  Procedure Laterality Date   BREAST BIOPSY Right 02/21/2020   affirm bx calcs, x marker, mild stromal fibrosis, benign, no atypia   BREAST BIOPSY Right 04/06/2021   right breast UIQ calcs, coil marker --> path showed COLUMNAR CELL CHANGE, FIBROADENOMATOID CHANGES, SCLEROSING ADENOSIS, LOBULAR NEOPLASIA   BREAST BIOPSY Right 04/06/2021   UIQ calcs, ribbon marker --> path showed FOCAL ATYPICAL DUCTAL HYPERPLASIA (ADH) IN A BACKGROUND OF COLUMNAR CELL CHANGE WITH USUAL DUCTAL HYPERPLASIA, APOCRINE METAPLASIA, SCLEROSING ADENOSIS, LOBULAR NEOPLASIA   CATARACT EXTRACTION, BILATERAL N/A    CHOLECYSTECTOMY     COLONOSCOPY     EXCISION OF  BREAST BIOPSY Right 05/08/2021   Procedure: EXCISION OF BREAST BIOPSY W/ Needle localization;  Surgeon: Herbert Pun, MD;  Location: [facility] ORS;  Service: General;  Laterality: Right;   TONSILLECTOMY     WISDOM TOOTH EXTRACTION     Patient Active Problem List   Diagnosis Date Noted   Breast neoplasm, Tis (DCIS), 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 female who presents to outpatient physical therapy with a referral for medical diagnosis lumbar DDD (degenerative disc disease), lumbar radiculitis. This patient's chief complaints consist of intermittent low back pain with radiation to left glute, lateral knee, and calf leading to the following functional deficits: difficulty with usual activities such as lifting, walking, visiting her son and daughter in law in [place], riding on the plane, logging/bouncing, walking up stairs, prolonged sitting, carrying, dressing. Relevant past medical history and comorbidities include anxiety/depression (see's therapist 1x a month, and working to get into a psychologist), GERD, HTN, HLD, migraines/headaches, osteopenia, basal cell carcinoma removed, CTS, arthritis, atypical ductal hyperplasia of right breast (surgically removed, did not do radiation/chemo),  bilateral cataract surgery, cholecystectomy, hx of kidney stones, lumbar and cervical spine degenerative disc disease.  Patient denies hx of stroke, seizures, lung problems, heart problems, diabetes, unexplained weight loss, unexplained changes in bowel or bladder problems, unexplained stumbling or dropping things, and spinal surgery   PRECAUTIONS: fall risk  SUBJECTIVE:                                                                                                                                                                                       SUBJECTIVE STATEMENT:  Patient reports she was really sore in her left leg and some in right leg the day after her last PT session. After that her pain has been how it usual is. Today she woke up and her right arm felt numb and that has gone away except in the left thumb. She has had some numbness on the right hand on her ipad it has happened before. She has a new cushion to sit on that came in the mail. It is from [employer] and she really likes the shape of it.   She is interested in chair yoga and using her exercise bike. She has done a mixture of yoga and piliates in the past. [employer] is the most convenient place for her to go.    PAIN:  Are you having pain? NPRS 3-4/10 in left LE lower let "not bad"    OBJECTIVE  SELF-REPORTED FUNCTION FOTO score: 52/100 (lumbar spine questionnaire)  TODAY'S TREATMENT:   Therapeutic exercise: to centralize symptoms and improve ROM, strength, muscular endurance, and activity tolerance required for successful completion of functional activities.  - NuStep level 5 using bilateral upper and lower extremities. Seat/handle setting 6/10. For improved extremity mobility, muscular endurance, and activity tolerance; and to induce the analgesic effect of aerobic exercise, stimulate improved joint nutrition, and prepare body structures and systems for following interventions. x 6  minutes. Average SPM = 63. Needed assistance with set up.   - prone alternating hip extension with abdominal brace, 1x10 to learn exercise and attempt to add arms (unable to coordinate effectively), 1x10 each side with 5 second holds. 2 pillows under abdomen to assist with ROM and decrease lumbar extension.   Modality: (unbilled) Dry needling performed to left buttocks region to decrease pain and spasms along patient's left buttocks and left LE region with patient in prone with  two pillows under abdomen utilizing 2 dry needle(s) .76m x 1055mwith 3 sticks at piriformis and one stick at glute max . Patient educated about the risks and benefits from therapy and verbally consents to treatment.  Dry needling performed by SaEverlean AlstromSnGraylon GoodT,  DPT who is certified in this technique.  Manual therapy: to reduce pain and tissue tension, improve range of motion, neuromodulation, in order to promote improved ability to complete functional activities. PRONE with two pillows under abdomen - STM to left buttocks muscles focusing on most painful regions (piriformis and QF).   Pt required multimodal cuing for proper technique and to facilitate improved neuromuscular control, strength, range of motion, and functional ability resulting in improved performance and form.  PATIENT EDUCATION:  Education details:  Exercise purpose/form. Self management techniques.  dry needling.  Reviewed cancelation/no-show policy with patient and confirmed patient has correct phone number for clinic; patient verbalized understanding (11/01/22). Person educated: Patient Education method: Explanation, verbal/tactile cuing, and Demonstration Education comprehension: verbalized understanding and needs further education   HOME EXERCISE PROGRAM: Access Code: [email or web address] URL: [email or web address] Date: 11/15/2022 Prepared by: Rosita Kea  Exercises - Seated Slump Nerve Glide  - 1 x daily - 1 sets - 1 minute time - Neutral Curl Up with Arms Across Chest  - 1 x daily - 5 sets - 20 seconds hold - Recumbent Bike  - 5 x weekly - 15-30 min time  [place] [[email or web address]] view at [email or web address]" using code: [email or web address]  SQUAT OVERHEAD PRESS - DUMBBELL -  Complete 3 Sets, Perform 1 Times a Day  thoracic extension stretch -  Hold 5 Seconds, Complete 3 Sets, Perform 1 Times a Day  ASSESSMENT:   CLINICAL IMPRESSION: Patient arrives to PT reporting significant pain in the left LE for a day  after last PT session. She did not seem to have much effect from dry needling to lumbar spine last session. Today's session focused on dry needling to left buttock region. Muted response to dry needling noted with patient denying report of deep achiness, and no sign of muscle twitching. Patient seemed less painful as she stayed in the prone position with two pillows under her abdomen and reported feeling better by end of visit with less pain present. She tolerated prone hip extension exercise well. Patient demonstrates limitations in cognition with difficulty sequencing exercises, remembering details from last PT session, and describing her symptoms over multiple days, which negatively affects communication between PT and patient and affects her rehab. Patient would benefit from continued management of limiting condition by skilled physical therapist to address remaining impairments and functional limitations to work towards stated goals and return to PLOF or maximal functional independence.   From PT Eval on 11/01/2022:  Patient is a 82 y.o. 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 Patient had bilateral neural tension without reproduction of symptoms and significant localized tenderness and tension in left deep hip musculature suggesting involvement of piriformis and muscles in this region. Patient demonstrated some unsteadiness on feet during exam and would benefit from further screening for balance.  Patient presents with significant pain, paresthesia, posture, muscle tension, motor control, work capacity, muscle performance (strength/power/endurance) and activity tolerance impairments that are limiting ability to complete her usual activities such as lifting, walking, visiting her son and daughter in law in [place],  riding on the plane, logging/bouncing, walking up stairs, prolonged sitting, carrying, dressing without difficulty. Patient will benefit from skilled physical therapy intervention to address current body structure impairments and activity limitations to improve function and work towards goals set in current POC in order to return to prior level  of function or maximal functional improvement.      OBJECTIVE IMPAIRMENTS: decreased activity tolerance, decreased balance, decreased coordination, decreased endurance, decreased knowledge of condition, decreased mobility, difficulty walking, decreased ROM, decreased strength, hypomobility, impaired perceived functional ability, increased muscle spasms, impaired flexibility, improper body mechanics, postural dysfunction, and pain.    ACTIVITY LIMITATIONS: carrying, lifting, sitting, squatting, stairs, and dressing   PARTICIPATION LIMITATIONS: cleaning, interpersonal relationship, community activity, and   usual activities such as lifting, walking, visiting her son and daughter in law in [place], riding on the plane, logging/bouncing, walking up stairs, prolonged sitting, carrying, dressing   PERSONAL FACTORS: Age, Past/current experiences, Time since onset of injury/illness/exacerbation, and 3+ comorbidities:   anxiety/depression (see's therapist 1x a month, and working to get into a psychologist), GERD, HTN, HLD, migraines/headaches, osteopenia, basal cell carcinoma removed, CTS, arthritis, atypical ductal hyperplasia of right breast (surgically removed, did not do radiation/chemo), bilateral cataract surgery, cholecystectomy, hx of kidney stones, lumbar and cervical spine degenerative disc disease are also affecting patient's functional outcome.    REHAB POTENTIAL: Good   CLINICAL DECISION MAKING: Stable/uncomplicated   EVALUATION COMPLEXITY: Low     GOALS: Goals reviewed with patient? No   SHORT TERM GOALS: Target date: 11/15/2022   Patient will be  independent with initial home exercise program for self-management of symptoms. Baseline: Initial HEP to be provided at visit 2 as appropriate (11/01/22); initial HEP provided at visit 2 (11/08/2022); Goal status: In-progress     LONG TERM GOALS: Target date: 01/24/2023   Patient will be independent with a long-term home exercise program for self-management of symptoms.  Baseline: Initial HEP to be provided at visit 2 as appropriate (11/01/22);initial HEP provided at visit 2 (11/08/2022); Goal status: In-progress   2.  Patient will demonstrate improved FOTO to equal or greater than 59 by visit #11 to demonstrate improvement in overall condition and self-reported functional ability.  Baseline: 51 (11/01/22); Goal status: In-progress   3.  Patient will improve left hip strength to equal or grater than right hip strength to improve her ability to climb and descend stairs. Baseline: left weaker in hip flexion, extension, and abduction - see objective (11/01/22); Goal status: In-progress   4.  Patient will report equal or less than 1/10 concordant pain during functional activity to improve her ability to return to traveling, walking, and navigating stairs.  Baseline: up to 8/10 (11/01/22); Goal status: In-progress   5.  Patient will complete community, work and/or recreational activities without limitation due to current condition.  Baseline: difficulty with usual activities such as lifting, walking, visiting her son and daughter in law in [place], riding on the plane, jogging/bouncing, walking up stairs, prolonged sitting, carrying, dressing (11/01/22); Goal status: In-progress     PLAN:   PT FREQUENCY: 1-2x/week   PT DURATION: 12 weeks   PLANNED INTERVENTIONS: Therapeutic exercises, Therapeutic activity, Neuromuscular re-education, Balance training, Gait training, Patient/Family education, Self Care, Joint mobilization, Stair training, DME instructions, Dry Needling, Electrical  stimulation, Spinal mobilization, Cryotherapy, Moist heat, Manual therapy, and Re-evaluation.   PLAN FOR NEXT SESSION: update HEP as appropriate, progressive core/LE/functional strengthening as tolerated, manual therapy as needed, balance interventions as needed.    Nancy Nordmann, PT, DPT 11/18/2022, 3:33 PM  [facility] [address] [place], [place] [address] P: [phone] I F: [phone]

## 2022-11-22 ENCOUNTER — Ambulatory Visit: Payer: Medicare Other | Admitting: Physical Therapy

## 2022-11-22 ENCOUNTER — Encounter: Payer: Self-pay | Admitting: Physical Therapy

## 2022-11-22 DIAGNOSIS — M5459 Other low back pain: Secondary | ICD-10-CM

## 2022-11-22 DIAGNOSIS — R262 Difficulty in walking, not elsewhere classified: Secondary | ICD-10-CM

## 2022-11-22 DIAGNOSIS — M5432 Sciatica, left side: Secondary | ICD-10-CM

## 2022-11-22 NOTE — Therapy (Signed)
OUTPATIENT PHYSICAL THERAPY TREATMENT NOTE   Patient Name: Kristen Singh MRN: [id number] DOB:Dec 10, 1940, 82 y.o., female Today's Date: 11/22/2022  PCP: Cheryll Cockayne, MD  REFERRING PROVIDER: Allene Dillon, FNP   END OF SESSION:   PT End of Session - 11/22/22 1352     Visit Number 6    Number of Visits 24    Date for PT Re-Evaluation 01/24/23    Authorization Type MEDICARE PART B reporting from 11/01/2022    Progress Note Due on Visit 10    PT Start Time 1351    PT Stop Time 1429    PT Time Calculation (min) 38 min    Activity Tolerance Patient tolerated treatment well    Behavior During Therapy [facility] for tasks assessed/performed                Past Medical History:  Diagnosis Date   Anxiety    Arthritis    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   Atypical mole 03/29/2017   right medial shoulder/mild   Basal cell carcinoma 04/10/2019   left lower pretibial   Carpal tunnel syndrome    Cortical cataract    DDD (degenerative disc disease), cervical    DDD (degenerative disc disease), lumbar    Depression    DOE (dyspnea on exertion)    GERD (gastroesophageal reflux disease)    Headache    History of kidney stones    HLD (hyperlipidemia)    Hypertension    Migraines    Osteopenia    Past Surgical History:  Procedure Laterality Date   BREAST BIOPSY Right 02/21/2020   affirm bx calcs, x marker, mild stromal fibrosis, benign, no atypia   BREAST BIOPSY Right 04/06/2021   right breast UIQ calcs, coil marker --> path showed COLUMNAR CELL CHANGE, FIBROADENOMATOID CHANGES, SCLEROSING ADENOSIS, LOBULAR NEOPLASIA   BREAST BIOPSY Right 04/06/2021   UIQ calcs, ribbon marker --> path showed FOCAL ATYPICAL DUCTAL HYPERPLASIA (ADH) IN A BACKGROUND OF COLUMNAR CELL CHANGE WITH USUAL DUCTAL HYPERPLASIA, APOCRINE METAPLASIA, SCLEROSING ADENOSIS, LOBULAR NEOPLASIA   CATARACT EXTRACTION, BILATERAL N/A    CHOLECYSTECTOMY     COLONOSCOPY     EXCISION OF  BREAST BIOPSY Right 05/08/2021   Procedure: EXCISION OF BREAST BIOPSY W/ Needle localization;  Surgeon: Herbert Pun, MD;  Location: [facility] ORS;  Service: General;  Laterality: Right;   TONSILLECTOMY     WISDOM TOOTH EXTRACTION     Patient Active Problem List   Diagnosis Date Noted   Breast neoplasm, Tis (DCIS), 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 female who presents to outpatient physical therapy with a referral for medical diagnosis lumbar DDD (degenerative disc disease), lumbar radiculitis. This patient's chief complaints consist of intermittent low back pain with radiation to left glute, lateral knee, and calf leading to the following functional deficits: difficulty with usual activities such as lifting, walking, visiting her son and daughter in law in [place], riding on the plane, logging/bouncing, walking up stairs, prolonged sitting, carrying, dressing. Relevant past medical history and comorbidities include anxiety/depression (see's therapist 1x a month, and working to get into a psychologist), GERD, HTN, HLD, migraines/headaches, osteopenia, basal cell carcinoma removed, CTS, arthritis, atypical ductal hyperplasia of right breast (surgically removed, did not do  radiation/chemo), bilateral cataract surgery, cholecystectomy, hx of kidney stones, lumbar and cervical spine degenerative disc disease.  Patient denies hx of stroke, seizures, lung problems, heart problems, diabetes, unexplained weight loss, unexplained changes in bowel or bladder problems, unexplained stumbling or dropping things, and spinal surgery   PRECAUTIONS: fall risk  SUBJECTIVE:                                                                                                                                                                                       SUBJECTIVE STATEMENT:  Patient reports she had increased pain for two days after last PT session more down the left leg and some in the right leg. 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 Patient thinks it might be getting worse. She states she went through it with her mother whose memory problems started to show at about the same age as patient is new. She states she thinks about it a lot.  She states she has not yet done her HEP.  She also sees a mental health therapist and they discuss it as well. She referred patient to someone who did testing but patient was hoping she would help her down the road. She sometimes talks with her current mental health therapist about ways to help her cope with changes.   She is interested in chair yoga and using her exercise bike. She has done a mixture of yoga and piliates in the past. [employer] is the most convenient place for her to go.    PAIN:  Are you having pain? NPRS 0/10   OBJECTIVE   TODAY'S TREATMENT:   Therapeutic exercise: to centralize symptoms and improve ROM, strength, muscular endurance, and activity tolerance required for successful completion of functional activities.  - NuStep level 6 using bilateral upper and lower extremities. Seat/handle setting 6/10. For improved extremity mobility, muscular endurance, and activity tolerance; and to induce the analgesic effect of aerobic exercise, stimulate improved joint nutrition, and prepare body structures and systems for following interventions. x 6  minutes. Average SPM =50. Needed assistance with set up.  - seated lumbar roll out with clear theraball, self selected pace for 3 min. - hooklying curl up, 3x30 seconds with B feet on mat (tried with feet lifted  but increased left glute pain).  - Quadruped bird dog (alternating shoulder flexion/contralateral hip extension with core muscles braced).  Cuing for abdominal brace. 1x5 each side after learning how to complete exercise.  - child's pose stretch 1x2 min  with breathing.  - education about strategies to improve participation in HEP including reducing HEP to one exercises and PT providing sticky notes for patient to put on TV remote and table to help her remember to complete her exercise.   Pt required multimodal cuing for proper technique and to facilitate improved neuromuscular control, strength, range of motion, and functional ability resulting in improved performance and form.  PATIENT EDUCATION:  Education details:  Exercise purpose/form. Self management techniques.  dry needling.  Reviewed cancelation/no-show policy with patient and confirmed patient has correct phone number for clinic; patient verbalized understanding (11/01/22). Person educated: Patient Education method: Explanation, verbal/tactile cuing, and Demonstration Education comprehension: verbalized understanding and needs further education   HOME EXERCISE PROGRAM: Access Code: [email or web address] URL: [email or web address] Date: 11/15/2022 Prepared by: Rosita Kea  Exercises - Seated Slump Nerve Glide  - 1 x daily - 1 sets - 1 minute time - Neutral Curl Up with Arms Across Chest  - 1 x daily - 5 sets - 20 seconds hold - Recumbent Bike  - 5 x weekly - 15-30 min time  [place] [[email or web address]] view at [email or web address]" using code: [email or web address]  SQUAT OVERHEAD PRESS - DUMBBELL -  Complete 3 Sets, Perform 1 Times a Day  thoracic extension stretch -  Hold 5 Seconds, Complete 3 Sets, Perform 1 Times a Day  ASSESSMENT:   CLINICAL IMPRESSION: Patient arrives to PT reporting significant pain in L LE and a little in R LE after last PT session. She also awknowledges that she suffers from difficulty with memory which is  distressing to her. She was receptive to conversations about how to improve her ability to remember and follow through with HEP participation. HEP was consolidated to one exercise patient was used to performing prior to having pain (recumbent bike for 15 min each day), and PT assisted patient in coming up with strategies to help remember to participate in HEP at home. Patient continued to brainstorm how to do exercises performed in clinic today at home, even though it is unrealistic for her to remember how to perform them correctly without supervision at this point. Patient needed a lot of cuing and guidance for exercises performed today in session. She appears to have intolerance to upper lumbar region extension that increases her left LE pain but did well with flexion based exercises or exercises that used abdominal muscles to prevent increased lordosis in the low back. Plan to continue with tolerated core/functional/LE exercises as appropriate. Patient would benefit from continued management of limiting condition by skilled physical therapist to address remaining impairments and functional limitations to work towards stated goals and return to PLOF or maximal functional independence.   From PT Eval on 11/01/2022:  Patient is a 82 y.o. 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 Patient had bilateral neural tension without reproduction of symptoms and significant localized tenderness and tension in left deep hip musculature suggesting involvement of piriformis and muscles in this region. Patient demonstrated some unsteadiness on feet during exam and would benefit from further screening for balance.  Patient presents with significant pain, paresthesia, posture, muscle tension, motor control, work capacity, muscle performance  (strength/power/endurance) and activity tolerance impairments that are limiting ability to complete her usual activities such as lifting, walking, visiting her son and daughter in law in [place], riding on the plane, logging/bouncing, walking  up stairs, prolonged sitting, carrying, dressing without difficulty. Patient will benefit from skilled physical therapy intervention to address current body structure impairments and activity limitations to improve function and work towards goals set in current POC in order to return to prior level of function or maximal functional improvement.      OBJECTIVE IMPAIRMENTS: decreased activity tolerance, decreased balance, decreased coordination, decreased endurance, decreased knowledge of condition, decreased mobility, difficulty walking, decreased ROM, decreased strength, hypomobility, impaired perceived functional ability, increased muscle spasms, impaired flexibility, improper body mechanics, postural dysfunction, and pain.    ACTIVITY LIMITATIONS: carrying, lifting, sitting, squatting, stairs, and dressing   PARTICIPATION LIMITATIONS: cleaning, interpersonal relationship, community activity, and   usual activities such as lifting, walking, visiting her son and daughter in law in [place], riding on the plane, logging/bouncing, walking up stairs, prolonged sitting, carrying, dressing   PERSONAL FACTORS: Age, Past/current experiences, Time since onset of injury/illness/exacerbation, and 3+ comorbidities:   anxiety/depression (see's therapist 1x a month, and working to get into a psychologist), GERD, HTN, HLD, migraines/headaches, osteopenia, basal cell carcinoma removed, CTS, arthritis, atypical ductal hyperplasia of right breast (surgically removed, did not do radiation/chemo), bilateral cataract surgery, cholecystectomy, hx of kidney stones, lumbar and cervical spine degenerative disc disease are also affecting patient's functional outcome.    REHAB POTENTIAL:  Good   CLINICAL DECISION MAKING: Stable/uncomplicated   EVALUATION COMPLEXITY: Low     GOALS: Goals reviewed with patient? No   SHORT TERM GOALS: Target date: 11/15/2022   Patient will be independent with initial home exercise program for self-management of symptoms. Baseline: Initial HEP to be provided at visit 2 as appropriate (11/01/22); initial HEP provided at visit 2 (11/08/2022); Goal status: In-progress     LONG TERM GOALS: Target date: 01/24/2023   Patient will be independent with a long-term home exercise program for self-management of symptoms.  Baseline: Initial HEP to be provided at visit 2 as appropriate (11/01/22);initial HEP provided at visit 2 (11/08/2022); Goal status: In-progress   2.  Patient will demonstrate improved FOTO to equal or greater than 59 by visit #11 to demonstrate improvement in overall condition and self-reported functional ability.  Baseline: 51 (11/01/22); Goal status: In-progress   3.  Patient will improve left hip strength to equal or grater than right hip strength to improve her ability to climb and descend stairs. Baseline: left weaker in hip flexion, extension, and abduction - see objective (11/01/22); Goal status: In-progress   4.  Patient will report equal or less than 1/10 concordant pain during functional activity to improve her ability to return to traveling, walking, and navigating stairs.  Baseline: up to 8/10 (11/01/22); Goal status: In-progress   5.  Patient will complete community, work and/or recreational activities without limitation due to current condition.  Baseline: difficulty with usual activities such as lifting, walking, visiting her son and daughter in law in [place], riding on the plane, jogging/bouncing, walking up stairs, prolonged sitting, carrying, dressing (11/01/22); Goal status: In-progress     PLAN:   PT FREQUENCY: 1-2x/week   PT DURATION: 12 weeks   PLANNED INTERVENTIONS: Therapeutic exercises, Therapeutic  activity, Neuromuscular re-education, Balance training, Gait training, Patient/Family education, Self Care, Joint mobilization, Stair training, DME instructions, Dry Needling, Electrical stimulation, Spinal mobilization, Cryotherapy, Moist heat, Manual therapy, and Re-evaluation.   PLAN FOR NEXT SESSION: update HEP as appropriate, progressive core/LE/functional strengthening as tolerated, manual therapy as needed, balance interventions as needed.    Nancy Nordmann, PT, DPT 11/22/2022, 8:03 PM  [facility]  [facility] [address] [place], [place] [address] P: [phone] I F: [phone]

## 2022-11-25 ENCOUNTER — Ambulatory Visit: Payer: Medicare Other | Admitting: Physical Therapy

## 2022-11-29 ENCOUNTER — Ambulatory Visit: Payer: Medicare Other | Attending: Family Medicine | Admitting: Physical Therapy

## 2022-11-29 DIAGNOSIS — M5459 Other low back pain: Secondary | ICD-10-CM | POA: Insufficient documentation

## 2022-11-29 DIAGNOSIS — R262 Difficulty in walking, not elsewhere classified: Secondary | ICD-10-CM | POA: Insufficient documentation

## 2022-11-29 DIAGNOSIS — M5432 Sciatica, left side: Secondary | ICD-10-CM | POA: Diagnosis present

## 2022-11-29 NOTE — Therapy (Signed)
OUTPATIENT PHYSICAL THERAPY TREATMENT NOTE   Patient Name: Kristen Singh MRN: [id number] DOB:23-Feb-1941, 82 y.o., female Today's Date: 11/29/2022  PCP: Cheryll Cockayne, MD  REFERRING PROVIDER: Allene Dillon, FNP   END OF SESSION:   PT End of Session - 11/29/22 1401     Visit Number 7    Number of Visits 24    Date for PT Re-Evaluation 01/24/23    Authorization Type MEDICARE PART B reporting from 11/01/2022    Progress Note Due on Visit 10    PT Start Time 1345    PT Stop Time 1425    PT Time Calculation (min) 40 min    Activity Tolerance Patient tolerated treatment well    Behavior During Therapy [facility] for tasks assessed/performed                Past Medical History:  Diagnosis Date   Anxiety    Arthritis    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   Atypical mole 03/29/2017   right medial shoulder/mild   Basal cell carcinoma 04/10/2019   left lower pretibial   Carpal tunnel syndrome    Cortical cataract    DDD (degenerative disc disease), cervical    DDD (degenerative disc disease), lumbar    Depression    DOE (dyspnea on exertion)    GERD (gastroesophageal reflux disease)    Headache    History of kidney stones    HLD (hyperlipidemia)    Hypertension    Migraines    Osteopenia    Past Surgical History:  Procedure Laterality Date   BREAST BIOPSY Right 02/21/2020   affirm bx calcs, x marker, mild stromal fibrosis, benign, no atypia   BREAST BIOPSY Right 04/06/2021   right breast UIQ calcs, coil marker --> path showed COLUMNAR CELL CHANGE, FIBROADENOMATOID CHANGES, SCLEROSING ADENOSIS, LOBULAR NEOPLASIA   BREAST BIOPSY Right 04/06/2021   UIQ calcs, ribbon marker --> path showed FOCAL ATYPICAL DUCTAL HYPERPLASIA (ADH) IN A BACKGROUND OF COLUMNAR CELL CHANGE WITH USUAL DUCTAL HYPERPLASIA, APOCRINE METAPLASIA, SCLEROSING ADENOSIS, LOBULAR NEOPLASIA   CATARACT EXTRACTION, BILATERAL N/A    CHOLECYSTECTOMY     COLONOSCOPY     EXCISION OF  BREAST BIOPSY Right 05/08/2021   Procedure: EXCISION OF BREAST BIOPSY W/ Needle localization;  Surgeon: Herbert Pun, MD;  Location: [facility] ORS;  Service: General;  Laterality: Right;   TONSILLECTOMY     WISDOM TOOTH EXTRACTION     Patient Active Problem List   Diagnosis Date Noted   Breast neoplasm, Tis (DCIS), 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 female who presents to outpatient physical therapy with a referral for medical diagnosis lumbar DDD (degenerative disc disease), lumbar radiculitis. This patient's chief complaints consist of intermittent low back pain with radiation to left glute, lateral knee, and calf leading to the following functional deficits: difficulty with usual activities such as lifting, walking, visiting her son and daughter in law in [place], riding on the plane, logging/bouncing, walking up stairs, prolonged sitting, carrying, dressing. Relevant past medical history and comorbidities include anxiety/depression (see's therapist 1x a month, and working to get into a psychologist), GERD, HTN, HLD, migraines/headaches, osteopenia, basal cell carcinoma removed, CTS, arthritis, atypical ductal hyperplasia of right breast (surgically removed, did not do  radiation/chemo), bilateral cataract surgery, cholecystectomy, hx of kidney stones, lumbar and cervical spine degenerative disc disease.  Patient denies hx of stroke, seizures, lung problems, heart problems, diabetes, unexplained weight loss, unexplained changes in bowel or bladder problems, unexplained stumbling or dropping things, and spinal surgery   PRECAUTIONS: fall risk  SUBJECTIVE:                                                                                                                                                                                       SUBJECTIVE STATEMENT:  Patient reports she has no pain upon arrival. She states she missed her last PT appointment because of GI upset. She states she has needed to take quite a bit of tylenol since last PT session. She states she rode her bike once for at least 15 min since last PT session. She did not have trouble with it. She states she had a lot of trouble walking over the weekend due to pain.   She is interested in chair yoga and using her exercise bike. She has done a mixture of yoga and piliates in the past. [employer] is the most convenient place for her to go.    PAIN:  Are you having pain? NPRS 0/10   OBJECTIVE   TODAY'S TREATMENT:   Therapeutic exercise: to centralize symptoms and improve ROM, strength, muscular endurance, and activity tolerance required for successful completion of functional activities.  - NuStep level 6-7 using bilateral upper and lower extremities. Seat/handle setting 6/10. For improved extremity mobility, muscular endurance, and activity tolerance; and to induce the analgesic effect of aerobic exercise, stimulate improved joint nutrition, and prepare body structures and systems for following interventions. x 6  minutes. Average SPM =73. Needed assistance with set up. Strong cuing to stay above 70 spm.  - seated lumbar roll out with clear theraball, self selected pace forwards and diagonals, for 3 min.cuing for technique and breathing.  - seated sciatic nerve glider, 1x5 each side.  - hooklying curl up, 5x30 seconds with B table top position (increased left glute pain at first, improved with better abdominal brace).  - Quadruped bird dog (alternating shoulder flexion/contralateral hip extension with core muscles braced).  Cuing for abdominal brace. 2x5 each side after learning how to complete exercise.  - child's pose stretch 1x3 min with breathing after  each bird dog set.   Pt required multimodal cuing for proper technique and to facilitate improved neuromuscular control, strength, range of motion, and functional ability resulting in improved performance and form.  PATIENT EDUCATION:  Education details:  Exercise purpose/form. Self management techniques.  dry needling.  Reviewed cancelation/no-show policy with patient and  confirmed patient has correct phone number for clinic; patient verbalized understanding (11/01/22). Person educated: Patient Education method: Explanation, verbal/tactile cuing, and Demonstration Education comprehension: verbalized understanding and needs further education   HOME EXERCISE PROGRAM: Access Code: [email or web address] URL: [email or web address] Date: 11/15/2022 Prepared by: Rosita Kea  Exercises - Seated Slump Nerve Glide  - 1 x daily - 1 sets - 1 minute time - Neutral Curl Up with Arms Across Chest  - 1 x daily - 5 sets - 20 seconds hold - Recumbent Bike  - 5 x weekly - 15-30 min time  [place] [[email or web address]] view at [email or web address]" using code: [email or web address]  SQUAT OVERHEAD PRESS - DUMBBELL -  Complete 3 Sets, Perform 1 Times a Day  thoracic extension stretch -  Hold 5 Seconds, Complete 3 Sets, Perform 1 Times a Day  ASSESSMENT:   CLINICAL IMPRESSION: Patient arrives with continued difficulty with pain in her upper back, low back, and left leg. She was able to tolerate more exercise today and continued to have pain in the left leg during exercises that resolved with rest. She continues to complain of thoracic spine pain. She continues to need a lot of cuing for exercise form and for memory deficits. Patient would benefit from continued management of limiting condition by skilled physical therapist to address remaining impairments and functional limitations to work towards stated goals and return to PLOF or maximal functional independence.   From PT Eval on 11/01/2022:  Patient is a 82 y.o.  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 Patient had bilateral neural tension without reproduction of symptoms and significant localized tenderness and tension in left deep hip musculature suggesting involvement of piriformis and muscles in this region. Patient demonstrated some unsteadiness on feet during exam and would benefit from further screening for balance.  Patient presents with significant pain, paresthesia, posture, muscle tension, motor control, work capacity, muscle performance (strength/power/endurance) and activity tolerance impairments that are limiting ability to complete her usual activities such as lifting, walking, visiting her son and daughter in law in [place], riding on the plane, logging/bouncing, walking up stairs, prolonged sitting, carrying, dressing without difficulty. Patient will benefit from skilled physical therapy intervention to address current body structure impairments and activity limitations to improve function and work towards goals set in current POC in order to return to prior level of function or maximal functional improvement.      OBJECTIVE IMPAIRMENTS: decreased activity tolerance, decreased balance, decreased coordination, decreased endurance, decreased knowledge of condition, decreased mobility, difficulty walking, decreased ROM, decreased strength, hypomobility, impaired perceived functional ability, increased muscle spasms, impaired flexibility, improper body mechanics, postural dysfunction, and pain.    ACTIVITY LIMITATIONS: carrying, lifting, sitting, squatting, stairs, and dressing   PARTICIPATION LIMITATIONS: cleaning, interpersonal relationship, community activity, and   usual activities such as lifting, walking, visiting her son and daughter in law in [place],  riding on the plane, logging/bouncing, walking up stairs, prolonged sitting, carrying, dressing   PERSONAL FACTORS: Age, Past/current experiences, Time since onset of injury/illness/exacerbation, and 3+ comorbidities:   anxiety/depression (see's therapist 1x a month, and working to get into a psychologist), GERD, HTN, HLD, migraines/headaches, osteopenia, basal cell carcinoma removed, CTS, arthritis, atypical ductal hyperplasia of right breast (surgically removed, did not do radiation/chemo), bilateral cataract surgery, cholecystectomy, hx of kidney stones, lumbar and cervical spine degenerative disc disease are also  affecting patient's functional outcome.    REHAB POTENTIAL: Good   CLINICAL DECISION MAKING: Stable/uncomplicated   EVALUATION COMPLEXITY: Low     GOALS: Goals reviewed with patient? No   SHORT TERM GOALS: Target date: 11/15/2022   Patient will be independent with initial home exercise program for self-management of symptoms. Baseline: Initial HEP to be provided at visit 2 as appropriate (11/01/22); initial HEP provided at visit 2 (11/08/2022); Goal status: In-progress     LONG TERM GOALS: Target date: 01/24/2023   Patient will be independent with a long-term home exercise program for self-management of symptoms.  Baseline: Initial HEP to be provided at visit 2 as appropriate (11/01/22);initial HEP provided at visit 2 (11/08/2022); Goal status: In-progress   2.  Patient will demonstrate improved FOTO to equal or greater than 59 by visit #11 to demonstrate improvement in overall condition and self-reported functional ability.  Baseline: 51 (11/01/22); Goal status: In-progress   3.  Patient will improve left hip strength to equal or grater than right hip strength to improve her ability to climb and descend stairs. Baseline: left weaker in hip flexion, extension, and abduction - see objective (11/01/22); Goal status: In-progress   4.  Patient will report equal or less than  1/10 concordant pain during functional activity to improve her ability to return to traveling, walking, and navigating stairs.  Baseline: up to 8/10 (11/01/22); Goal status: In-progress   5.  Patient will complete community, work and/or recreational activities without limitation due to current condition.  Baseline: difficulty with usual activities such as lifting, walking, visiting her son and daughter in law in [place], riding on the plane, jogging/bouncing, walking up stairs, prolonged sitting, carrying, dressing (11/01/22); Goal status: In-progress     PLAN:   PT FREQUENCY: 1-2x/week   PT DURATION: 12 weeks   PLANNED INTERVENTIONS: Therapeutic exercises, Therapeutic activity, Neuromuscular re-education, Balance training, Gait training, Patient/Family education, Self Care, Joint mobilization, Stair training, DME instructions, Dry Needling, Electrical stimulation, Spinal mobilization, Cryotherapy, Moist heat, Manual therapy, and Re-evaluation.   PLAN FOR NEXT SESSION: update HEP as appropriate, progressive core/LE/functional strengthening as tolerated, manual therapy as needed, balance interventions as needed.    Nancy Nordmann, PT, DPT 11/29/2022, 2:29 PM  [facility] [address] [place], [place] [address] P: [phone] I F: [phone]

## 2022-12-02 ENCOUNTER — Ambulatory Visit: Payer: Medicare Other | Admitting: Physical Therapy

## 2022-12-02 ENCOUNTER — Encounter: Payer: Self-pay | Admitting: Physical Therapy

## 2022-12-02 DIAGNOSIS — R262 Difficulty in walking, not elsewhere classified: Secondary | ICD-10-CM

## 2022-12-02 DIAGNOSIS — M5459 Other low back pain: Secondary | ICD-10-CM

## 2022-12-02 DIAGNOSIS — M5432 Sciatica, left side: Secondary | ICD-10-CM

## 2022-12-02 NOTE — Therapy (Signed)
OUTPATIENT PHYSICAL THERAPY TREATMENT NOTE   Patient Name: Kristen Singh MRN: [id number] DOB:09-Jul-1941, 82 y.o., female Today's Date: 12/02/2022  PCP: Cheryll Cockayne, MD  REFERRING PROVIDER: Allene Dillon, FNP   END OF SESSION:   PT End of Session - 12/02/22 1358     Visit Number 8    Number of Visits 24    Date for PT Re-Evaluation 01/24/23    Authorization Type MEDICARE PART B reporting from 11/01/2022    Progress Note Due on Visit 10    PT Start Time 1349    PT Stop Time 1427    PT Time Calculation (min) 38 min    Activity Tolerance Patient tolerated treatment well    Behavior During Therapy [facility] for tasks assessed/performed              Past Medical History:  Diagnosis Date   Anxiety    Arthritis    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   Atypical mole 03/29/2017   right medial shoulder/mild   Basal cell carcinoma 04/10/2019   left lower pretibial   Carpal tunnel syndrome    Cortical cataract    DDD (degenerative disc disease), cervical    DDD (degenerative disc disease), lumbar    Depression    DOE (dyspnea on exertion)    GERD (gastroesophageal reflux disease)    Headache    History of kidney stones    HLD (hyperlipidemia)    Hypertension    Migraines    Osteopenia    Past Surgical History:  Procedure Laterality Date   BREAST BIOPSY Right 02/21/2020   affirm bx calcs, x marker, mild stromal fibrosis, benign, no atypia   BREAST BIOPSY Right 04/06/2021   right breast UIQ calcs, coil marker --> path showed COLUMNAR CELL CHANGE, FIBROADENOMATOID CHANGES, SCLEROSING ADENOSIS, LOBULAR NEOPLASIA   BREAST BIOPSY Right 04/06/2021   UIQ calcs, ribbon marker --> path showed FOCAL ATYPICAL DUCTAL HYPERPLASIA (ADH) IN A BACKGROUND OF COLUMNAR CELL CHANGE WITH USUAL DUCTAL HYPERPLASIA, APOCRINE METAPLASIA, SCLEROSING ADENOSIS, LOBULAR NEOPLASIA   CATARACT EXTRACTION, BILATERAL N/A    CHOLECYSTECTOMY     COLONOSCOPY     EXCISION OF  BREAST BIOPSY Right 05/08/2021   Procedure: EXCISION OF BREAST BIOPSY W/ Needle localization;  Surgeon: Herbert Pun, MD;  Location: [facility] ORS;  Service: General;  Laterality: Right;   TONSILLECTOMY     WISDOM TOOTH EXTRACTION     Patient Active Problem List   Diagnosis Date Noted   Breast neoplasm, Tis (DCIS), 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 female who presents to outpatient physical therapy with a referral for medical diagnosis lumbar DDD (degenerative disc disease), lumbar radiculitis. This patient's chief complaints consist of intermittent low back pain with radiation to left glute, lateral knee, and calf leading to the following functional deficits: difficulty with usual activities such as lifting, walking, visiting her son and daughter in law in [place], riding on the plane, logging/bouncing, walking up stairs, prolonged sitting, carrying, dressing. Relevant past medical history and comorbidities include anxiety/depression (see's therapist 1x a month, and working to get into a psychologist), GERD, HTN, HLD, migraines/headaches, osteopenia, basal cell carcinoma removed, CTS, arthritis, atypical ductal hyperplasia of right breast (surgically removed, did not do radiation/chemo), bilateral  cataract surgery, cholecystectomy, hx of kidney stones, lumbar and cervical spine degenerative disc disease.  Patient denies hx of stroke, seizures, lung problems, heart problems, diabetes, unexplained weight loss, unexplained changes in bowel or bladder problems, unexplained stumbling or dropping things, and spinal surgery   PRECAUTIONS: fall risk  SUBJECTIVE:                                                                                                                                                                                       SUBJECTIVE STATEMENT:  Patient reports she was very sore after last PT session. She went to [employer] after her last PT session and ended up going home because she was having trouble walking. She states her pain can be shooting pain. She didn't do any of her HEP because she was so sore after last PT session.  She cannot tell that PT is helping her at all.  She has her lumbar MRI on Monday.   She is interested in chair yoga and using her exercise bike. She has done a mixture of yoga and piliates in the past. [employer] is the most convenient place for her to go.    PAIN:  Are you having pain? NPRS 5/10 Left low back down to foot and intermittently right low back.    OBJECTIVE   TODAY'S TREATMENT:   Therapeutic exercise: to centralize symptoms and improve ROM, strength, muscular endurance, and activity tolerance required for successful completion of functional activities.  - NuStep level 5-6 using bilateral upper and lower extremities. Seat/handle setting 6/10. For improved extremity mobility, muscular endurance, and activity tolerance; and to induce the analgesic effect of aerobic exercise, stimulate improved joint nutrition, and prepare body structures and systems for following interventions. x 6  minutes. Average SPM =71. Needed assistance with set up. Cuing to stay above 70 spm.  - trial of SPC with instructions on how to walk with it, complete stairs with it, and adjust it to correct height. Patient practiced 4 steps and ambulating approx 40 feet. Patient was interested in the canes she saw hanging in the clinic and was thinking about getting one.  - seated lumbar roll out with clear theraball, self selected pace forwards and diagonals, for 3 min.cuing for technique and breathing. (Worse leg pain) - seated lat pull down 1x5 with 20# (discontinued due to increasing left LE pain).   - standing lumbar extension with table behind body, 3x10 (decreased L LE pain and difficulty with walking but reports numbness).   Pt required multimodal cuing for proper technique and to facilitate improved neuromuscular control, strength, range of motion, and functional ability resulting in improved performance  and form.  PATIENT EDUCATION:  Education details:  Exercise purpose/form. Self management techniques.  dry needling.  Reviewed cancelation/no-show policy with patient and confirmed patient has correct phone number for clinic; patient verbalized understanding (11/01/22). Person educated: Patient Education method: Explanation, verbal/tactile cuing, and Demonstration Education comprehension: verbalized understanding and needs further education   HOME EXERCISE PROGRAM: Access Code: [email or web address] URL: [email or web address] Date: 12/02/2022 Prepared by: Rosita Kea  Exercises - Recumbent Bike  - 5 x weekly - 15 time - Standing Lumbar Extension with Counter  - 4 x daily - 2 sets - 20 reps - 1 second hold  [place] [[email or web address]] view at [email or web address]" using code: [email or web address]  SQUAT OVERHEAD PRESS - DUMBBELL -  Complete 3 Sets, Perform 1 Times a Day  thoracic extension stretch -  Hold 5 Seconds, Complete 3 Sets, Perform 1 Times a Day  ASSESSMENT:   CLINICAL IMPRESSION: Patient arrives with report of worsened pain in her left LE that affects her daily activities following last PT session. Continued with core strengthening and interventions to help decrease pain and improve function. Patient continues to require a lot of cuing for cognitive and physical limitations to more effectively participate in exercises. She has poor carry over and needs cuing to remember how to perform exercises performed at past sessions. Expected more improvement in symptoms at this point. Today's exercises were modified to help accommodate pain. Trial of standing lumbar extension  appeared to decrease left LE pain and improved walking ability. Reports 2-3/10 pain by end of session. Provided handout to complete this exercise at home. Patient would benefit from continued management of limiting condition by skilled physical therapist to address remaining impairments and functional limitations to work towards stated goals and return to PLOF or maximal functional independence.    From PT Eval on 11/01/2022:  Patient is a 82 y.o. 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 Patient had bilateral neural tension without reproduction of symptoms and significant localized tenderness and tension in left deep hip musculature suggesting involvement of piriformis and muscles in this region. Patient demonstrated some unsteadiness on feet during exam and would benefit from further screening for balance.  Patient presents with significant pain, paresthesia, posture, muscle tension, motor control, work capacity, muscle performance (strength/power/endurance) and activity tolerance impairments that are limiting ability to complete her usual activities such as lifting, walking, visiting her son and daughter in law in [place], riding on the plane, logging/bouncing, walking up stairs, prolonged sitting, carrying, dressing without difficulty. Patient will benefit from skilled physical therapy intervention to address current body structure impairments and activity limitations to improve function and work towards goals set in current POC in order to return to prior level of function or maximal functional improvement.      OBJECTIVE IMPAIRMENTS: decreased activity tolerance, decreased balance, decreased coordination, decreased endurance, decreased knowledge of condition, decreased mobility, difficulty walking, decreased ROM,  decreased strength, hypomobility, impaired perceived functional ability, increased muscle spasms, impaired flexibility, improper body mechanics, postural dysfunction, and pain.    ACTIVITY LIMITATIONS: carrying, lifting, sitting, squatting, stairs, and dressing   PARTICIPATION LIMITATIONS: cleaning, interpersonal relationship, community activity, and   usual activities such as lifting, walking, visiting her son and daughter in law in [place], riding on the plane, logging/bouncing, walking up stairs, prolonged sitting, carrying, dressing   PERSONAL FACTORS: Age, Past/current experiences, Time  since onset of injury/illness/exacerbation, and 3+ comorbidities:   anxiety/depression (see's therapist 1x a month, and working to get into a psychologist), GERD, HTN, HLD, migraines/headaches, osteopenia, basal cell carcinoma removed, CTS, arthritis, atypical ductal hyperplasia of right breast (surgically removed, did not do radiation/chemo), bilateral cataract surgery, cholecystectomy, hx of kidney stones, lumbar and cervical spine degenerative disc disease are also affecting patient's functional outcome.    REHAB POTENTIAL: Good   CLINICAL DECISION MAKING: Stable/uncomplicated   EVALUATION COMPLEXITY: Low     GOALS: Goals reviewed with patient? No   SHORT TERM GOALS: Target date: 11/15/2022   Patient will be independent with initial home exercise program for self-management of symptoms. Baseline: Initial HEP to be provided at visit 2 as appropriate (11/01/22); initial HEP provided at visit 2 (11/08/2022); Goal status: In-progress     LONG TERM GOALS: Target date: 01/24/2023   Patient will be independent with a long-term home exercise program for self-management of symptoms.  Baseline: Initial HEP to be provided at visit 2 as appropriate (11/01/22);initial HEP provided at visit 2 (11/08/2022); Goal status: In-progress   2.  Patient will demonstrate improved FOTO to equal or greater than 59 by visit  #11 to demonstrate improvement in overall condition and self-reported functional ability.  Baseline: 51 (11/01/22); Goal status: In-progress   3.  Patient will improve left hip strength to equal or grater than right hip strength to improve her ability to climb and descend stairs. Baseline: left weaker in hip flexion, extension, and abduction - see objective (11/01/22); Goal status: In-progress   4.  Patient will report equal or less than 1/10 concordant pain during functional activity to improve her ability to return to traveling, walking, and navigating stairs.  Baseline: up to 8/10 (11/01/22); Goal status: In-progress   5.  Patient will complete community, work and/or recreational activities without limitation due to current condition.  Baseline: difficulty with usual activities such as lifting, walking, visiting her son and daughter in law in [place], riding on the plane, jogging/bouncing, walking up stairs, prolonged sitting, carrying, dressing (11/01/22); Goal status: In-progress     PLAN:   PT FREQUENCY: 1-2x/week   PT DURATION: 12 weeks   PLANNED INTERVENTIONS: Therapeutic exercises, Therapeutic activity, Neuromuscular re-education, Balance training, Gait training, Patient/Family education, Self Care, Joint mobilization, Stair training, DME instructions, Dry Needling, Electrical stimulation, Spinal mobilization, Cryotherapy, Moist heat, Manual therapy, and Re-evaluation.   PLAN FOR NEXT SESSION: update HEP as appropriate, progressive core/LE/functional strengthening as tolerated, manual therapy as needed, balance interventions as needed.    Nancy Nordmann, PT, DPT 12/02/2022, 2:31 PM  [facility] [address] [place], [place] [address] P: [phone] I F: [phone]

## 2022-12-06 ENCOUNTER — Ambulatory Visit: Payer: Medicare Other | Admitting: Physical Therapy

## 2022-12-06 ENCOUNTER — Ambulatory Visit
Admission: RE | Admit: 2022-12-06 | Discharge: 2022-12-06 | Disposition: A | Discharge status: Home / Self Care | Payer: Medicare Other | Source: Ambulatory Visit | Attending: Family Medicine | Admitting: Family Medicine

## 2022-12-06 DIAGNOSIS — M5416 Radiculopathy, lumbar region: Secondary | ICD-10-CM

## 2022-12-09 ENCOUNTER — Ambulatory Visit: Payer: Medicare Other | Admitting: Physical Therapy

## 2022-12-09 ENCOUNTER — Encounter: Payer: Self-pay | Admitting: Physical Therapy

## 2022-12-09 DIAGNOSIS — M5459 Other low back pain: Secondary | ICD-10-CM | POA: Diagnosis not present

## 2022-12-09 DIAGNOSIS — R262 Difficulty in walking, not elsewhere classified: Secondary | ICD-10-CM

## 2022-12-09 DIAGNOSIS — M5432 Sciatica, left side: Secondary | ICD-10-CM

## 2022-12-09 NOTE — Therapy (Signed)
OUTPATIENT PHYSICAL THERAPY TREATMENT NOTE / PROGRESS NOTE Dates of reporting from 11/01/2022 to 12/09/2022   Patient Name: Kristen Singh MRN: [id number] DOB:Jun 27, 1941, 82 y.o., female Today's Date: 12/09/2022  PCP: Cheryll Cockayne, MD  REFERRING PROVIDER: Allene Dillon, FNP   END OF SESSION:   PT End of Session - 12/09/22 1349     Visit Number 9    Number of Visits 24    Date for PT Re-Evaluation 01/24/23    Authorization Type MEDICARE PART B reporting from 11/01/2022    Progress Note Due on Visit 10    PT Start Time [id number]    PT Stop Time 1427    PT Time Calculation (min) 38 min    Activity Tolerance Patient tolerated treatment well    Behavior During Therapy [facility] for tasks assessed/performed               Past Medical History:  Diagnosis Date   Anxiety    Arthritis    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   Atypical mole 03/29/2017   right medial shoulder/mild   Basal cell carcinoma 04/10/2019   left lower pretibial   Carpal tunnel syndrome    Cortical cataract    DDD (degenerative disc disease), cervical    DDD (degenerative disc disease), lumbar    Depression    DOE (dyspnea on exertion)    GERD (gastroesophageal reflux disease)    Headache    History of kidney stones    HLD (hyperlipidemia)    Hypertension    Migraines    Osteopenia    Past Surgical History:  Procedure Laterality Date   BREAST BIOPSY Right 02/21/2020   affirm bx calcs, x marker, mild stromal fibrosis, benign, no atypia   BREAST BIOPSY Right 04/06/2021   right breast UIQ calcs, coil marker --> path showed COLUMNAR CELL CHANGE, FIBROADENOMATOID CHANGES, SCLEROSING ADENOSIS, LOBULAR NEOPLASIA   BREAST BIOPSY Right 04/06/2021   UIQ calcs, ribbon marker --> path showed FOCAL ATYPICAL DUCTAL HYPERPLASIA (ADH) IN A BACKGROUND OF COLUMNAR CELL CHANGE WITH USUAL DUCTAL HYPERPLASIA, APOCRINE METAPLASIA, SCLEROSING ADENOSIS, LOBULAR NEOPLASIA   CATARACT EXTRACTION,  BILATERAL N/A    CHOLECYSTECTOMY     COLONOSCOPY     EXCISION OF BREAST BIOPSY Right 05/08/2021   Procedure: EXCISION OF BREAST BIOPSY W/ Needle localization;  Surgeon: Herbert Pun, MD;  Location: [facility] ORS;  Service: General;  Laterality: Right;   TONSILLECTOMY     WISDOM TOOTH EXTRACTION     Patient Active Problem List   Diagnosis Date Noted   Breast neoplasm, Tis (DCIS), 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 female who presents to outpatient physical therapy with a referral for medical diagnosis lumbar DDD (degenerative disc disease), lumbar radiculitis. This patient's chief complaints consist of intermittent low back pain with radiation to left glute, lateral knee, and calf leading to the following functional deficits: difficulty with usual activities such as lifting, walking, visiting her son and daughter in law in [place], riding on the plane, logging/bouncing, walking up stairs, prolonged sitting, carrying, dressing. Relevant past medical history and comorbidities include anxiety/depression (see's therapist 1x a month, and working to get into a psychologist), GERD, HTN, HLD, migraines/headaches, osteopenia, basal cell carcinoma removed, CTS, arthritis, atypical ductal  hyperplasia of right breast (surgically removed, did not do radiation/chemo), bilateral cataract surgery, cholecystectomy, hx of kidney stones, lumbar and cervical spine degenerative disc disease.  Patient denies hx of stroke, seizures, lung problems, heart problems, diabetes, unexplained weight loss, unexplained changes in bowel or bladder problems, unexplained stumbling or dropping things, and spinal surgery   PRECAUTIONS: fall risk  SUBJECTIVE:                                                                                                                                                                                       SUBJECTIVE STATEMENT:  Patient reports she is feeling better today than yesterday. She did a lot of activity yesterday. She states she continue to feel worse the day after PT. Today she brought a "Lo-Bak TRAX" device to show PT that she got from a chiropractic office. She has a follow up with Allene Dillon, FNP tomorrow after her lumbar MRI this Monday. She states she has looked at the report (see below) but has not discussed yet with her providers. She states her pain continues to come and go. She states she did not do her lumbar extension exercise because she could not find anything that was the right height. She has not done any of the rest of her HEP because she she has not felt like it due to pain.   Planning for long term exercise for health: She is interested in chair yoga and using her exercise bike. She has done a mixture of yoga and piliates in the past. [employer] is the most convenient place for her to go.    PAIN:  Are you having pain? NPRS 2-3/10 Left low back and glute down to L calf and foot.   OBJECTIVE  SELF-REPORTED FUNCTION FOTO score: 42/100 (lumbar spine questionnaire)   IMAGING:  Lumbar MRI report dated 12/06/2022:  CLINICAL DATA:  Low back and lower extremity pain.   EXAM: MRI LUMBAR SPINE WITHOUT CONTRAST   TECHNIQUE: Multiplanar, multisequence MR imaging of the lumbar spine was performed. No intravenous contrast was administered.   COMPARISON:  MRI lumbar spine 03/05/2013.   FINDINGS: Segmentation:  Standard.   Alignment: 0.6 cm anterolisthesis L4 on L5 due to facet arthropathy is slightly increased since the prior MRI. Alignment is otherwise maintained.   Vertebrae: No fracture, evidence of discitis or worrisome lesion. Small hemangioma in L3 is noted. There is some  degenerative endplate signal change at L3-4.   Conus medullaris and cauda equina: Conus extends to the T12-L1 level. Conus and cauda equina appear normal. 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   Paraspinal and other soft tissues: Negative.  Disc levels:   T11-12 and T12-L1 are imaged in the sagittal plane only and negative.   L1-2: Mild-to-moderate facet degenerative change and a minimal disc bulge. No stenosis.   L2-3: Shallow disc bulge, moderate facet arthropathy and ligamentum flavum thickening. Mild right subarticular recess narrowing is seen. The foramina are open.   L3-4: There is loss of disc space height with a shallow bulge, ligamentum flavum thickening and moderate facet arthropathy. Mild central canal and bilateral foraminal narrowing appear unchanged.   L4-5: The disc is uncovered. There is severe facet degenerative disease and bulky ligamentum flavum thickening. The disc is uncovered with a shallow bulge. Severe central canal stenosis and bilateral lateral recess narrowing are again seen. Narrowing in the left lateral recess is worse than on the prior study. Mild to moderate foraminal narrowing is worse on the left.   L5-S1: There is a shallow disc bulge somewhat more prominent to the right. There is moderate narrowing in the right subarticular recess which could impact the right S1 root. The foramina are open.   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 Lateral recess narrowing on the left at L4-5 is worse than on the prior MRI.   New narrowing in the right subarticular recess at L5-S1 with impingement on the right S1 root.     Electronically Signed   By: Inge Rise M.D.   On: 12/07/2022 08:37  MUSCLE PERFORMANCE (MMT):  *Indicates pain 11/01/22 12/09/22 Date  Joint/Motion R/L R/L R/L  Hip        Flexion (L1, L2) 4/4+ 4+/4 /  Extension (knee ext) 5/4+ 4+/4+ /  Abduction 5/4 5/4+ /  Knee         Extension (L3) 5/5 / /  Flexion (S2) 4+/4+ / /  Ankle/Foot        Dorsiflexion (L4) 5/5 / /  Great toe extension (L5) 4/4 / /  Eversion (S1) 5/5 / /  Plantarflexion (S1) 4+/4+ / /  Comments:  11/01/2022: able to toe walk with B UE support, able to heel walk with no UE support. Slight momentary symptoms  on left side when toe walking.  12/09/2022: mild increase in pain in B glutes after strength testing.    TODAY'S TREATMENT:   Therapeutic exercise: to centralize symptoms and improve ROM, strength, muscular endurance, and activity tolerance required for successful completion of functional activities.  - NuStep level 5 using bilateral upper and lower extremities. Seat/handle setting 6/10. For improved extremity mobility, muscular endurance, and activity tolerance; and to induce the analgesic effect of aerobic exercise, stimulate improved joint nutrition, and prepare body structures and systems for following interventions. x 15  minutes. Average SPM =76. Needed assistance with set up. Cuing to stay above 70 spm.  - strength testing for hips (see above).  - hooklying curl up, 5x30 seconds with on LE extended, arms crossed above chest. (tried at table top position first but discontinued due to increased left glute pain).  - Quadruped bird dog (alternating shoulder flexion/contralateral hip extension with core muscles braced).  Cuing for abdominal brace. 2x5 each side after learning how to complete exercise.  - child's pose 2x30-60 seconds.  - Education on HEP including handout   Pt required multimodal cuing for proper technique and to facilitate improved neuromuscular control, strength, range of motion, and functional ability resulting in improved performance and form.  PATIENT EDUCATION:  Education details:  Exercise purpose/form. Self management techniques.  POC, possible discharge recommendations.  Reviewed cancelation/no-show  policy with patient and confirmed patient has correct phone number  for clinic; patient verbalized understanding (11/01/22). Person educated: Patient Education method: Explanation, verbal/tactile cuing, and Demonstration Education comprehension: verbalized understanding and needs further education   HOME EXERCISE PROGRAM: Access Code: [email or web address] URL: [email or web address] Date: 12/09/2022 Prepared by: Rosita Kea  Exercises - Recumbent Bike  - 5 x weekly - 15-30 time - Neutral Curl Up with Arms Across Chest  - 1 x daily - 5 sets - 30 seconds hold - Bird Dog  - 1 x daily - 3 sets - 5 reps - 5 second hold - Child's Pose Knees Apart and Hands Forward   - 1 x daily - 2 reps - 30-60 seconds hold  ASSESSMENT:   CLINICAL IMPRESSION: Patient has attended 9 physical therapy sessions since starting current episode of care on 11/01/2022. She has attended regularly and participated fully in clinic. However, she demonstrates forgetfulness an cognitive limitations that appear to be preventing her or making participating in HEP very challenging. She has reported almost no participation in HEP. Limitations with recall also limit the usefulness of her symptom recall of response to individual PT sessions. Overall, she appears to have made no progress since starting PT and continues to report increased symptoms following PT sessions. Patient may benefit from continued PT to help prevent inactivity and secondary complications from this but otherwise PT does not seem to be an effective intervention for her at this time. She may benefit more from further PT if something changes to make exercises more tolerable for her. PT recommends return to referring clinician for further medical evaluation. Patient has been provided with a long term HEP that is appropriate for her current level of physical function, but she has not successfully participated in her HEP regularly. Plan to discharge from PT unless referring clinician requests more.  From PT Eval on 11/01/2022:  Patient is a  82 y.o. 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 Patient had bilateral neural tension without reproduction of symptoms and significant localized tenderness and tension in left deep hip musculature suggesting involvement of piriformis and muscles in this region. Patient demonstrated some unsteadiness on feet during exam and would benefit from further screening for balance.  Patient presents with significant pain, paresthesia, posture, muscle tension, motor control, work capacity, muscle performance (strength/power/endurance) and activity tolerance impairments that are limiting ability to complete her usual activities such as lifting, walking, visiting her son and daughter in law in [place], riding on the plane, logging/bouncing, walking up stairs, prolonged sitting, carrying, dressing without difficulty. Patient will benefit from skilled physical therapy intervention to address current body structure impairments and activity limitations to improve function and work towards goals set in current POC in order to return to prior level of function or maximal functional improvement.      OBJECTIVE IMPAIRMENTS: decreased activity tolerance, decreased balance, decreased coordination, decreased endurance, decreased knowledge of condition, decreased mobility, difficulty walking, decreased ROM, decreased strength, hypomobility, impaired perceived functional ability, increased muscle spasms, impaired flexibility, improper body mechanics, postural dysfunction, and pain.    ACTIVITY LIMITATIONS: carrying, lifting, sitting, squatting, stairs, and dressing   PARTICIPATION LIMITATIONS: cleaning, interpersonal relationship, community activity, and   usual activities such as lifting, walking, visiting her son and daughter in law in  [place], riding on the plane, logging/bouncing, walking up stairs, prolonged sitting,  carrying, dressing   PERSONAL FACTORS: Age, Past/current experiences, Time since onset of injury/illness/exacerbation, and 3+ comorbidities:   anxiety/depression (see's therapist 1x a month, and working to get into a psychologist), GERD, HTN, HLD, migraines/headaches, osteopenia, basal cell carcinoma removed, CTS, arthritis, atypical ductal hyperplasia of right breast (surgically removed, did not do radiation/chemo), bilateral cataract surgery, cholecystectomy, hx of kidney stones, lumbar and cervical spine degenerative disc disease are also affecting patient's functional outcome.    REHAB POTENTIAL: Good   CLINICAL DECISION MAKING: Stable/uncomplicated   EVALUATION COMPLEXITY: Low     GOALS: Goals reviewed with patient? No   SHORT TERM GOALS: Target date: 11/15/2022   Patient will be independent with initial home exercise program for self-management of symptoms. Baseline: Initial HEP to be provided at visit 2 as appropriate (11/01/22); initial HEP provided at visit 2 (11/08/2022); patient not participating, possibly due to cognitive limitations (12/09/2022); Goal status: No tMet     LONG TERM GOALS: Target date: 01/24/2023   Patient will be independent with a long-term home exercise program for self-management of symptoms.  Baseline: Initial HEP to be provided at visit 2 as appropriate (11/01/22);initial HEP provided at visit 2 (11/08/2022); she has not been doing HEP because she does not feel like it (because it hursts) lately, she does a little stretching before she gets up from the couch or from the bed (12/09/2022);  Goal status: Not Met   2.  Patient will demonstrate improved FOTO to equal or greater than 59 by visit #11 to demonstrate improvement in overall condition and self-reported functional ability.  Baseline: 51 (11/01/22); 52 at visit #5 (11/18/2022); 42 at visit #9 (12/09/2022);  Goal status: Not  Met   3.  Patient will improve left hip strength to equal or grater than right hip strength to improve her ability to climb and descend stairs. Baseline: left weaker in hip flexion, extension, and abduction - see objective (11/01/22); no significant improvement - see objective (12/09/2022);  Goal status: Not met   4.  Patient will report equal or less than 1/10 concordant pain during functional activity to improve her ability to return to traveling, walking, and navigating stairs.  Baseline: up to 8/10 (11/01/22); reports up to 8/10 in last 2 weeks (12/09/2022);  Goal status:Not met   5.  Patient will complete community, work and/or recreational activities without limitation due to current condition.  Baseline: difficulty with usual activities such as lifting, walking, visiting her son and daughter in law in [place], riding on the plane, jogging/bouncing, walking up stairs, prolonged sitting, carrying, dressing (11/01/22); reports no improvement since starting PT (12/09/2022);  Goal status: not met     PLAN:   PT FREQUENCY: 1-2x/week   PT DURATION: 12 weeks   PLANNED INTERVENTIONS: Therapeutic exercises, Therapeutic activity, Neuromuscular re-education, Balance training, Gait training, Patient/Family education, Self Care, Joint mobilization, Stair training, DME instructions, Dry Needling, Electrical stimulation, Spinal mobilization, Cryotherapy, Moist heat, Manual therapy, and Re-evaluation.   PLAN FOR NEXT SESSION: Discharge from [place], PT, DPT 12/09/2022, 8:06 PM  [facility] [address] [place], [place] [address] P: [phone] I F: [phone]

## 2023-01-27 ENCOUNTER — Other Ambulatory Visit: Payer: Self-pay | Admitting: Neurosurgery

## 2023-02-21 NOTE — Pre-Procedure Instructions (Addendum)
Surgical Instructions    Your procedure is scheduled on Wednesday, Mar 02, 2023.  Report to [facility] Main Entrance "A" at 1145 A.M, then check in with the Admitting office.  Call this number if you have problems the morning of surgery:  [phone]  If you have any questions prior to your surgery date call [phone]: Open Monday-Friday 8am-4pm If you experience any cold or flu symptoms such as cough, fever, chills, shortness of breath, etc. between now and your scheduled surgery, please notify us at the above number.     Remember:  Do not eat or drink after midnight the night before your surgery.     Take these medicines the morning of surgery with A SIP OF WATER :             gabapentin (NEURONTIN              azelastine (ASTELIN) nasal spray               sodium chloride (OCEAN)  nasal spray               ketotifen (ALAWAY) eye drops              metoprolol tartrate (LOPRESSOR)               omeprazole (PRILOSEC)               DULoxetine (CYMBALTA)   AS NEEDED: acetaminophen (TYLENOL)                        docusate sodium (COLACE)                        Sodium Chloride-Xylitol (XLEAR SINUS CARE SPRAY                       traMADol (ULTRAM)   Follow your surgeon's instructions on when to stop Aspirin.  If no instructions were given by your surgeon then you will need to call the office to get those instructions.    As of today, STOP taking any (unless otherwise instructed by your surgeon) Aleve, Naproxen, Ibuprofen, Motrin, Advil, Goody's, BC's, all herbal medications, fish oil, and all vitamins.                     Do NOT Smoke (Tobacco/Vaping) for 24 hours prior to your procedure.  If you use a CPAP at night, you may bring your mask/headgear for your overnight stay.   Contacts, glasses, piercing's, hearing aid's, dentures or partials may not be worn into surgery, please bring cases for these belongings.    For patients admitted to the hospital, discharge time will be  determined by your treatment team.   Patients discharged the day of surgery will not be allowed to drive home, and someone needs to stay with them for 24 hours.  SURGICAL WAITING ROOM VISITATION Patients having surgery or a procedure may have no more than 2 support people in the waiting area - these visitors may rotate.   Children under the age of 49 must have an adult with them who is not the patient. If the patient needs to stay at the hospital during part of their recovery, the visitor guidelines for inpatient rooms apply. Pre-op nurse will coordinate an appropriate time for 1 support person to accompany patient in pre-op.  This support person may not rotate.   Please  refer to the [facility] website for the visitor guidelines for Inpatients (after your surgery is over and you are in a regular room).    Special instructions:    Day of Surgery: Take a shower with CHG soap. Do not wear jewelry or makeup Do not wear lotions, powders, perfumes/colognes, or deodorant. Do not shave 48 hours prior to surgery.  Men may shave face and neck. Do not bring valuables to the hospital.  [facility] is not responsible for any belongings or valuables. Do not wear nail polish, gel polish, artificial nails, or any other type of covering on natural nails (fingers and toes) If you have artificial nails or gel coating that need to be removed by a nail salon, please have this removed prior to surgery. Artificial nails or gel coating may interfere with anesthesia's ability to adequately monitor your vital signs. Wear Clean/Comfortable clothing the morning of surgery Remember to brush your teeth WITH YOUR REGULAR TOOTHPASTE.   Please read over the following fact sheets that you were given.    If you received a COVID test during your pre-op visit  it is requested that you wear a mask when out in public, stay away from anyone that may not be feeling well and notify your surgeon if you develop symptoms. If you  have been in contact with anyone that has tested positive in the last 10 days please notify you surgeon.

## 2023-02-22 ENCOUNTER — Encounter (HOSPITAL_COMMUNITY)
Admission: RE | Admit: 2023-02-22 | Discharge: 2023-02-22 | Disposition: A | Discharge status: Home / Self Care | Payer: Medicare Other | Source: Ambulatory Visit | Attending: Neurosurgery | Admitting: Neurosurgery

## 2023-02-22 ENCOUNTER — Other Ambulatory Visit: Payer: Self-pay

## 2023-02-22 ENCOUNTER — Encounter (HOSPITAL_COMMUNITY): Payer: Self-pay

## 2023-02-22 VITALS — BP 136/78 | HR 65 | Resp 17 | Ht 62.0 in | Wt 159.3 lb

## 2023-02-22 DIAGNOSIS — I10 Essential (primary) hypertension: Secondary | ICD-10-CM | POA: Diagnosis not present

## 2023-02-22 DIAGNOSIS — Z01812 Encounter for preprocedural laboratory examination: Secondary | ICD-10-CM | POA: Diagnosis not present

## 2023-02-22 DIAGNOSIS — Z01818 Encounter for other preprocedural examination: Secondary | ICD-10-CM

## 2023-02-22 DIAGNOSIS — R9431 Abnormal electrocardiogram [ECG] [EKG]: Secondary | ICD-10-CM | POA: Diagnosis not present

## 2023-02-22 HISTORY — DX: Dyspnea, unspecified: R06.00

## 2023-02-22 HISTORY — DX: Dependence on other enabling machines and devices: Z99.89

## 2023-02-22 LAB — SURGICAL PCR SCREEN
MRSA, PCR: NEGATIVE
Staphylococcus aureus: NEGATIVE

## 2023-02-22 LAB — TYPE AND SCREEN
ABO/RH(D): A NEG
Antibody Screen: NEGATIVE

## 2023-02-22 NOTE — Progress Notes (Addendum)
Surgical Instructions    Your procedure is scheduled on Wednesday, Mar 02, 2023.  Report to [facility] Main Entrance "A" at 11:45 am A.M., then check in with the Admitting office.  Call this number if you have problems the morning of surgery:  [phone]   If you have any questions prior to your surgery date call [phone]: Open Monday-Friday 8am-4pm If you experience any cold or flu symptoms such as cough, fever, chills, shortness of breath, etc. between now and your scheduled surgery, please notify us at the above number     Remember:  Do not eat or drink after midnight the night before your surgery Thursday.     Take these medicines the morning of surgery with A SIP OF WATER:  gabapentin (NEURONTIN              azelastine (ASTELIN) nasal spray               sodium chloride (OCEAN)  nasal spray               ketotifen (ALAWAY) eye drops              metoprolol tartrate (LOPRESSOR)               omeprazole (PRILOSEC)               DULoxetine (CYMBALTA)    AS NEEDED: acetaminophen (TYLENOL)                        docusate sodium (COLACE)                        Sodium Chloride-Xylitol (XLEAR SINUS CARE SPRAY                       traMADol (ULTRAM)     As of today, STOP taking any Aspirin (unless otherwise instructed by your surgeon) Aleve, Naproxen, Ibuprofen, Motrin, Advil, Goody's, BC's, all herbal medications, fish oil, and all vitamins.           Do not wear jewelry or makeup. Do not wear lotions, powders, perfumes or deodorant. Do not shave 48 hours prior to surgery.   Do not bring valuables to the hospital. Do not wear nail polish, gel polish, artificial nails, or any other type of covering on natural nails (fingers and toes) If you have artificial nails or gel coating that need to be removed by a nail salon, please have this removed prior to surgery. Artificial nails or gel coating may interfere with anesthesia's ability to adequately monitor your vital  signs.  [facility] is not responsible for any belongings or valuables.    Contacts, glasses, hearing aids, dentures or partials may not be worn into surgery, please bring cases for these belongings   For patients admitted to the hospital, discharge time will be determined by your treatment team.    SURGICAL WAITING ROOM VISITATION Patients having surgery or a procedure may have no more than 2 support people in the waiting area - these visitors may rotate.   Children under the age of 44 must have an adult with them who is not the patient. If the patient needs to stay at the hospital during part of their recovery, the visitor guidelines for inpatient rooms apply. Pre-op nurse will coordinate an appropriate time for 1 support person to accompany patient in pre-op.  This support person may not rotate.  Please refer to [email or web address] for the visitor guidelines for Inpatients (after your surgery is over and you are in a regular room).    Special instructions:    Oral Hygiene is also important to reduce your risk of infection.  Remember - BRUSH YOUR TEETH THE MORNING OF SURGERY WITH YOUR REGULAR TOOTHPASTE   [facility] [facility]- Preparing For Surgery  Before surgery, you can play an important role. Because skin is not sterile, your skin needs to be as free of germs as possible. You can reduce the number of germs on your skin by washing with CHG (chlorahexidine gluconate) Soap before surgery.  CHG is an antiseptic cleaner which kills germs and bonds with the skin to continue killing germs even after washing.     Please do not use if you have an allergy to CHG or antibacterial soaps. If your skin becomes reddened/irritated stop using the CHG.  Do not shave (including legs and underarms) for at least 48 hours prior to first CHG shower. It is OK to shave your face.  Please follow these instructions carefully. Pre-Operative 5 CHG Bath Instructions  given.  All questions answered.     Apply the CHG Soap to your body ONLY FROM THE NECK DOWN.  Do not use on open wounds or open sores. Avoid contact with your eyes, ears, mouth and genitals (private parts). Wash Face and genitals (private parts)  with your normal soap.   Wash thoroughly, paying special attention to the area where your surgery will be performed.  Thoroughly rinse your body with warm water from the neck down.  DO NOT shower/wash with your normal soap after using and rinsing off the CHG Soap.  Pat yourself dry with a CLEAN TOWEL.  Wear CLEAN PAJAMAS to bed the night before surgery  Place CLEAN SHEETS on your bed.  DO NOT SLEEP WITH PETS.   Day of Surgery:  Take a shower with CHG soap. Wear Clean/Comfortable clothing the morning of surgery Do not apply any deodorants/lotions.   Remember to brush your teeth WITH YOUR REGULAR TOOTHPASTE.    If you received a COVID test during your pre-op visit, it is requested that you wear a mask when out in public, stay away from anyone that may not be feeling well, and notify your surgeon if you develop symptoms. If you have been in contact with anyone that has tested positive in the last 10 days, please notify your surgeon.    Please read over the following fact sheets that you were given.

## 2023-02-22 NOTE — Progress Notes (Signed)
PCP - Dr Daniel Nones Cardiologist - Dr Arnoldo Hooker [facility]  Chest x-ray - n/a EKG - 02/22/23 Stress Test - 11/02/16,  04/23/21 CE ECHO - 11/01/16 Cardiac Cath - approx 2008 unknown location  ICD Pacemaker/Loop - n/a  Sleep Study -  n/a CPAP - none  Diabetes - n/a  Aspirin Instructions: Follow your surgeon's instructions on when to stop aspirin prior to surgery,  If no instructions were given by your surgeon then you will need to call the office for those instructions.  NPO  Anesthesia review: Yes  STOP now taking any Aspirin (unless otherwise instructed by your surgeon), Aleve, Naproxen, Ibuprofen, Motrin, Advil, Goody's, BC's, all herbal medications, fish oil, and all vitamins.   Coronavirus Screening Do you have any of the following symptoms:  Cough yes/no: No Fever (>100.64F)  yes/no: No Runny nose Yes, allergy related Sore throat yes/no: No Difficulty breathing/shortness of breath  yes/no: No  Have you traveled in the last 14 days and where? yes/no: No  Patient verbalized understanding of instructions that were given to them at the PAT appointment. Patient was also instructed that they will need to review over the PAT instructions again at home before surgery.

## 2023-02-23 ENCOUNTER — Other Ambulatory Visit: Payer: Self-pay | Admitting: Neurosurgery

## 2023-02-23 NOTE — Anesthesia Preprocedure Evaluation (Addendum)
Anesthesia Evaluation  Patient identified by MRN, date of birth, ID band Patient awake    Reviewed: Allergy & Precautions, H&P , NPO status , Patient's Chart, lab work & pertinent test results, reviewed documented beta blocker date and time   Airway Mallampati: I  TM Distance: >3 FB Neck ROM: Full    Dental  (+) Teeth Intact, Dental Advisory Given   Pulmonary neg pulmonary ROS   Pulmonary exam normal breath sounds clear to auscultation       Cardiovascular hypertension (160/85 preop), Pt. on medications and Pt. on home beta blockers Normal cardiovascular exam Rhythm:Regular Rate:Normal  Previously evaluated by cardiology at [facility] clinic for DOE. Stress test 04/23/21 showed normal myocardial perfusion, EF 62%. When last seen 10/29/21 she was advised to follow up as needed.   Neuro/Psych  Headaches PSYCHIATRIC DISORDERS Anxiety Depression       GI/Hepatic Neg liver ROS,GERD  Controlled,,  Endo/Other  negative endocrine ROS    Renal/GU negative Renal ROS  negative genitourinary   Musculoskeletal  (+) Arthritis , Osteoarthritis,    Abdominal   Peds negative pediatric ROS (+)  Hematology negative hematology ROS (+)   Anesthesia Other Findings   Reproductive/Obstetrics negative OB ROS                             Anesthesia Physical Anesthesia Plan  ASA: 2  Anesthesia Plan: General   Post-op Pain Management: Tylenol PO (pre-op)*   Induction: Intravenous  PONV Risk Score and Plan: Ondansetron, Dexamethasone and Treatment may vary due to age or medical condition  Airway Management Planned: Oral ETT  Additional Equipment: None  Intra-op Plan:   Post-operative Plan: Extubation in OR  Informed Consent: I have reviewed the patients History and Physical, chart, labs and discussed the procedure including the risks, benefits and alternatives for the proposed anesthesia with the patient or  authorized representative who has indicated his/her understanding and acceptance.     Dental advisory given  Plan Discussed with: CRNA  Anesthesia Plan Comments: (  )        Anesthesia Quick Evaluation

## 2023-02-23 NOTE — Progress Notes (Signed)
Anesthesia Chart Review:  Previously evaluated by cardiology at [facility] clinic for DOE. Stress test 04/23/21 showed normal myocardial perfusion, EF 62%. When last seen 10/29/21 she was advised to follow up as needed.  PCP Dr. Daniel Nones follows chronic medical conditions. Last seen 02/09/23 and noted to be stable at that time. HTN well controlled. Upcoming lumbar surgery was discussed.   BMP 02/17/23 in care everywhere reviewed, WNL.  CBC 02/04/23 inc are everywhere reviewed, WNL.  EKG 02/22/23: Normal sinus rhythm. Rate 63. Nonspecific T wave abnormality  Nuclear stress test 04/23/21 (care everywhere): Normal treadmill EKG without evidence of ischemia or arrhythmia  Normal myocardial perfusion without evidence of myocardial ischemia     Kristen Singh [facility] Short Stay Center/Anesthesiology Phone [phone] 02/23/2023 4:43 PM

## 2023-02-24 ENCOUNTER — Other Ambulatory Visit: Payer: Self-pay | Admitting: General Surgery

## 2023-02-24 DIAGNOSIS — Z853 Personal history of malignant neoplasm of breast: Secondary | ICD-10-CM

## 2023-03-02 ENCOUNTER — Encounter (HOSPITAL_COMMUNITY): Payer: Self-pay | Admitting: Neurosurgery

## 2023-03-02 ENCOUNTER — Other Ambulatory Visit: Payer: Self-pay

## 2023-03-02 ENCOUNTER — Ambulatory Visit (HOSPITAL_COMMUNITY): Payer: Medicare Other | Admitting: Physician Assistant

## 2023-03-02 ENCOUNTER — Ambulatory Visit (HOSPITAL_COMMUNITY)
Admission: RE | Admit: 2023-03-02 | Discharge: 2023-03-03 | Disposition: A | Discharge status: Home / Self Care | Payer: Medicare Other | Attending: Neurosurgery | Admitting: Neurosurgery

## 2023-03-02 ENCOUNTER — Ambulatory Visit (HOSPITAL_COMMUNITY): Payer: Medicare Other

## 2023-03-02 ENCOUNTER — Ambulatory Visit (HOSPITAL_BASED_OUTPATIENT_CLINIC_OR_DEPARTMENT_OTHER): Payer: Medicare Other | Admitting: Anesthesiology

## 2023-03-02 ENCOUNTER — Encounter (HOSPITAL_COMMUNITY)
Admission: RE | Disposition: A | Discharge status: Home / Self Care | Payer: Self-pay | Source: Home / Self Care | Attending: Neurosurgery

## 2023-03-02 DIAGNOSIS — M48062 Spinal stenosis, lumbar region with neurogenic claudication: Secondary | ICD-10-CM | POA: Diagnosis not present

## 2023-03-02 DIAGNOSIS — I10 Essential (primary) hypertension: Secondary | ICD-10-CM | POA: Insufficient documentation

## 2023-03-02 DIAGNOSIS — K219 Gastro-esophageal reflux disease without esophagitis: Secondary | ICD-10-CM | POA: Diagnosis not present

## 2023-03-02 DIAGNOSIS — M5116 Intervertebral disc disorders with radiculopathy, lumbar region: Secondary | ICD-10-CM | POA: Diagnosis not present

## 2023-03-02 DIAGNOSIS — F419 Anxiety disorder, unspecified: Secondary | ICD-10-CM | POA: Insufficient documentation

## 2023-03-02 DIAGNOSIS — F32A Depression, unspecified: Secondary | ICD-10-CM | POA: Diagnosis not present

## 2023-03-02 DIAGNOSIS — F418 Other specified anxiety disorders: Secondary | ICD-10-CM

## 2023-03-02 DIAGNOSIS — M4316 Spondylolisthesis, lumbar region: Secondary | ICD-10-CM | POA: Diagnosis not present

## 2023-03-02 LAB — ABO/RH: ABO/RH(D): A NEG

## 2023-03-02 SURGERY — POSTERIOR LUMBAR FUSION 1 LEVEL
Anesthesia: General | Site: Spine Lumbar

## 2023-03-02 MED ORDER — ZOLPIDEM TARTRATE 5 MG PO TABS: 5.0000 mg | ORAL_TABLET | Freq: Every evening | ORAL | Status: DC | PRN

## 2023-03-02 MED ORDER — PROPOFOL 10 MG/ML IV BOLUS
INTRAVENOUS | Status: DC | PRN
  Administered 2023-03-02: 130 mg via INTRAVENOUS

## 2023-03-02 MED ORDER — 0.9 % SODIUM CHLORIDE (POUR BTL) OPTIME
TOPICAL | Status: DC | PRN
  Administered 2023-03-02: 1000 mL

## 2023-03-02 MED ORDER — BUPIVACAINE-EPINEPHRINE (PF) 0.5% -1:200000 IJ SOLN
INTRAMUSCULAR | Status: DC | PRN
  Administered 2023-03-02: 10 mL

## 2023-03-02 MED ORDER — DULOXETINE HCL 60 MG PO CPEP
60.0000 mg | ORAL_CAPSULE | Freq: Every day | ORAL | Status: DC
  Administered 2023-03-03: 60 mg via ORAL
  Filled 2023-03-02: qty 2
  Filled 2023-03-02: qty 1

## 2023-03-02 MED ORDER — AMISULPRIDE (ANTIEMETIC) 5 MG/2ML IV SOLN: 10.0000 mg | Freq: Once | INTRAVENOUS | Status: DC | PRN

## 2023-03-02 MED ORDER — LIDOCAINE 2% (20 MG/ML) 5 ML SYRINGE
INTRAMUSCULAR | Status: DC | PRN
  Administered 2023-03-02: 60 mg via INTRAVENOUS

## 2023-03-02 MED ORDER — CEFAZOLIN SODIUM-DEXTROSE 2-4 GM/100ML-% IV SOLN
2.0000 g | Freq: Three times a day (TID) | INTRAVENOUS | Status: AC
  Administered 2023-03-02 – 2023-03-03 (×2): 2 g via INTRAVENOUS
  Filled 2023-03-02 (×2): qty 100

## 2023-03-02 MED ORDER — BUPIVACAINE-EPINEPHRINE (PF) 0.5% -1:200000 IJ SOLN
INTRAMUSCULAR | Status: AC
  Filled 2023-03-02: qty 30

## 2023-03-02 MED ORDER — METOPROLOL TARTRATE 50 MG PO TABS
50.0000 mg | ORAL_TABLET | Freq: Two times a day (BID) | ORAL | Status: DC
  Administered 2023-03-02 – 2023-03-03 (×2): 50 mg via ORAL
  Filled 2023-03-02 (×2): qty 1

## 2023-03-02 MED ORDER — PANTOPRAZOLE SODIUM 40 MG PO TBEC
40.0000 mg | DELAYED_RELEASE_TABLET | Freq: Every day | ORAL | Status: DC
  Administered 2023-03-03: 40 mg via ORAL
  Filled 2023-03-02: qty 1

## 2023-03-02 MED ORDER — BUPIVACAINE LIPOSOME 1.3 % IJ SUSP
INTRAMUSCULAR | Status: AC
  Filled 2023-03-02: qty 20

## 2023-03-02 MED ORDER — OXYCODONE HCL 5 MG/5ML PO SOLN: 5.0000 mg | Freq: Once | ORAL | Status: DC | PRN

## 2023-03-02 MED ORDER — OXYCODONE HCL 5 MG PO TABS
5.0000 mg | ORAL_TABLET | ORAL | Status: DC | PRN
  Administered 2023-03-02 – 2023-03-03 (×3): 5 mg via ORAL
  Filled 2023-03-02 (×3): qty 1

## 2023-03-02 MED ORDER — THROMBIN 20000 UNITS EX SOLR: CUTANEOUS | Status: DC | PRN

## 2023-03-02 MED ORDER — CHLORHEXIDINE GLUCONATE 0.12 % MT SOLN
15.0000 mL | Freq: Once | OROMUCOSAL | Status: AC
  Administered 2023-03-02: 15 mL via OROMUCOSAL

## 2023-03-02 MED ORDER — LACTATED RINGERS IV SOLN: INTRAVENOUS | Status: DC

## 2023-03-02 MED ORDER — PHENOL 1.4 % MT LIQD: 1.0000 | OROMUCOSAL | Status: DC | PRN

## 2023-03-02 MED ORDER — ADULT MULTIVITAMIN W/MINERALS CH
1.0000 | ORAL_TABLET | Freq: Every day | ORAL | Status: DC
  Administered 2023-03-02 – 2023-03-03 (×2): 1 via ORAL
  Filled 2023-03-02 (×2): qty 1

## 2023-03-02 MED ORDER — BISACODYL 10 MG RE SUPP: 10.0000 mg | Freq: Every day | RECTAL | Status: DC | PRN

## 2023-03-02 MED ORDER — FENTANYL CITRATE (PF) 100 MCG/2ML IJ SOLN: 25.0000 ug | INTRAMUSCULAR | Status: DC | PRN

## 2023-03-02 MED ORDER — SURGIPHOR WOUND IRRIGATION SYSTEM - OPTIME: TOPICAL | Status: DC | PRN

## 2023-03-02 MED ORDER — PHENYLEPHRINE 80 MCG/ML (10ML) SYRINGE FOR IV PUSH (FOR BLOOD PRESSURE SUPPORT)
PREFILLED_SYRINGE | INTRAVENOUS | Status: DC | PRN
  Administered 2023-03-02 (×3): 80 ug via INTRAVENOUS

## 2023-03-02 MED ORDER — PROPOFOL 10 MG/ML IV BOLUS
INTRAVENOUS | Status: AC
  Filled 2023-03-02: qty 20

## 2023-03-02 MED ORDER — MENTHOL 3 MG MT LOZG: 1.0000 | LOZENGE | OROMUCOSAL | Status: DC | PRN

## 2023-03-02 MED ORDER — SUGAMMADEX SODIUM 200 MG/2ML IV SOLN
INTRAVENOUS | Status: DC | PRN
  Administered 2023-03-02: 200 mg via INTRAVENOUS

## 2023-03-02 MED ORDER — ORAL CARE MOUTH RINSE: 15.0000 mL | Freq: Once | OROMUCOSAL | Status: AC

## 2023-03-02 MED ORDER — DEXAMETHASONE SODIUM PHOSPHATE 10 MG/ML IJ SOLN
INTRAMUSCULAR | Status: DC | PRN
  Administered 2023-03-02: 10 mg via INTRAVENOUS

## 2023-03-02 MED ORDER — DOCUSATE SODIUM 100 MG PO CAPS
100.0000 mg | ORAL_CAPSULE | Freq: Two times a day (BID) | ORAL | Status: DC
  Administered 2023-03-02 – 2023-03-03 (×2): 100 mg via ORAL
  Filled 2023-03-02 (×2): qty 1

## 2023-03-02 MED ORDER — BUPIVACAINE LIPOSOME 1.3 % IJ SUSP: INTRAMUSCULAR | Status: DC | PRN

## 2023-03-02 MED ORDER — ACETAMINOPHEN 500 MG PO TABS
1000.0000 mg | ORAL_TABLET | Freq: Once | ORAL | Status: AC
  Administered 2023-03-02: 1000 mg via ORAL
  Filled 2023-03-02: qty 2

## 2023-03-02 MED ORDER — MORPHINE SULFATE (PF) 4 MG/ML IV SOLN: 4.0000 mg | INTRAVENOUS | Status: DC | PRN

## 2023-03-02 MED ORDER — KETOTIFEN FUMARATE 0.035 % OP SOLN
1.0000 [drp] | Freq: Every day | OPHTHALMIC | Status: DC
  Administered 2023-03-02: 1 [drp] via OPHTHALMIC
  Filled 2023-03-02 (×2): qty 5

## 2023-03-02 MED ORDER — THROMBIN 20000 UNITS EX SOLR
CUTANEOUS | Status: AC
  Filled 2023-03-02: qty 20000

## 2023-03-02 MED ORDER — FENTANYL CITRATE (PF) 250 MCG/5ML IJ SOLN
INTRAMUSCULAR | Status: DC | PRN
  Administered 2023-03-02 (×4): 50 ug via INTRAVENOUS

## 2023-03-02 MED ORDER — CHLORHEXIDINE GLUCONATE CLOTH 2 % EX PADS: 6.0000 | MEDICATED_PAD | Freq: Once | CUTANEOUS | Status: DC

## 2023-03-02 MED ORDER — GABAPENTIN 400 MG PO CAPS
800.0000 mg | ORAL_CAPSULE | Freq: Two times a day (BID) | ORAL | Status: DC
  Administered 2023-03-02 – 2023-03-03 (×2): 800 mg via ORAL
  Filled 2023-03-02 (×2): qty 2

## 2023-03-02 MED ORDER — SODIUM CHLORIDE 0.9 % IV SOLN
250.0000 mL | INTRAVENOUS | Status: DC
  Administered 2023-03-02: 250 mL via INTRAVENOUS

## 2023-03-02 MED ORDER — OXYCODONE HCL 5 MG PO TABS: 5.0000 mg | ORAL_TABLET | Freq: Once | ORAL | Status: DC | PRN

## 2023-03-02 MED ORDER — SODIUM CHLORIDE 0.9% FLUSH
3.0000 mL | Freq: Two times a day (BID) | INTRAVENOUS | Status: DC
  Administered 2023-03-02: 3 mL via INTRAVENOUS

## 2023-03-02 MED ORDER — BACITRACIN ZINC 500 UNIT/GM EX OINT
TOPICAL_OINTMENT | CUTANEOUS | Status: DC | PRN
  Administered 2023-03-02: 1 via TOPICAL

## 2023-03-02 MED ORDER — THROMBIN 5000 UNITS EX SOLR
CUTANEOUS | Status: AC
  Filled 2023-03-02: qty 5000

## 2023-03-02 MED ORDER — PRAVASTATIN SODIUM 40 MG PO TABS: 40.0000 mg | ORAL_TABLET | Freq: Every day | ORAL | Status: DC

## 2023-03-02 MED ORDER — ACETAMINOPHEN 500 MG PO TABS
1000.0000 mg | ORAL_TABLET | Freq: Four times a day (QID) | ORAL | Status: DC
  Administered 2023-03-02 – 2023-03-03 (×2): 1000 mg via ORAL
  Filled 2023-03-02 (×2): qty 2

## 2023-03-02 MED ORDER — ROCURONIUM BROMIDE 10 MG/ML (PF) SYRINGE
PREFILLED_SYRINGE | INTRAVENOUS | Status: DC | PRN
  Administered 2023-03-02 (×2): 20 mg via INTRAVENOUS
  Administered 2023-03-02: 60 mg via INTRAVENOUS

## 2023-03-02 MED ORDER — BISACODYL 5 MG PO TBEC
5.0000 mg | DELAYED_RELEASE_TABLET | Freq: Every day | ORAL | Status: DC | PRN
  Administered 2023-03-02: 5 mg via ORAL
  Filled 2023-03-02: qty 1

## 2023-03-02 MED ORDER — PHENYLEPHRINE HCL (PRESSORS) 10 MG/ML IV SOLN
INTRAVENOUS | Status: AC
  Filled 2023-03-02: qty 1

## 2023-03-02 MED ORDER — ONDANSETRON HCL 4 MG/2ML IJ SOLN: 4.0000 mg | Freq: Four times a day (QID) | INTRAMUSCULAR | Status: DC | PRN

## 2023-03-02 MED ORDER — ACETAMINOPHEN 325 MG PO TABS: 650.0000 mg | ORAL_TABLET | ORAL | Status: DC | PRN

## 2023-03-02 MED ORDER — TRAMADOL HCL 50 MG PO TABS: 50.0000 mg | ORAL_TABLET | Freq: Two times a day (BID) | ORAL | Status: DC | PRN

## 2023-03-02 MED ORDER — OXYCODONE HCL 5 MG PO TABS
10.0000 mg | ORAL_TABLET | ORAL | Status: DC | PRN
  Administered 2023-03-03: 10 mg via ORAL
  Filled 2023-03-02: qty 2

## 2023-03-02 MED ORDER — THROMBIN 5000 UNITS EX SOLR: OROMUCOSAL | Status: DC | PRN

## 2023-03-02 MED ORDER — CEFAZOLIN SODIUM-DEXTROSE 2-4 GM/100ML-% IV SOLN
2.0000 g | INTRAVENOUS | Status: AC
  Administered 2023-03-02: 2 g via INTRAVENOUS
  Filled 2023-03-02: qty 100

## 2023-03-02 MED ORDER — ONDANSETRON HCL 4 MG PO TABS: 4.0000 mg | ORAL_TABLET | Freq: Four times a day (QID) | ORAL | Status: DC | PRN

## 2023-03-02 MED ORDER — PHENYLEPHRINE HCL-NACL 20-0.9 MG/250ML-% IV SOLN
INTRAVENOUS | Status: DC | PRN
  Administered 2023-03-02: 25 ug/min via INTRAVENOUS

## 2023-03-02 MED ORDER — ONDANSETRON HCL 4 MG/2ML IJ SOLN: 4.0000 mg | Freq: Once | INTRAMUSCULAR | Status: DC | PRN

## 2023-03-02 MED ORDER — LORATADINE 10 MG PO TABS
10.0000 mg | ORAL_TABLET | Freq: Every day | ORAL | Status: DC
  Administered 2023-03-02 – 2023-03-03 (×2): 10 mg via ORAL
  Filled 2023-03-02 (×2): qty 1

## 2023-03-02 MED ORDER — SPIRONOLACTONE 25 MG PO TABS
25.0000 mg | ORAL_TABLET | Freq: Every day | ORAL | Status: DC
  Administered 2023-03-02 – 2023-03-03 (×2): 25 mg via ORAL
  Filled 2023-03-02 (×2): qty 1

## 2023-03-02 MED ORDER — FENTANYL CITRATE (PF) 250 MCG/5ML IJ SOLN
INTRAMUSCULAR | Status: AC
  Filled 2023-03-02: qty 5

## 2023-03-02 MED ORDER — CYCLOBENZAPRINE HCL 10 MG PO TABS
10.0000 mg | ORAL_TABLET | Freq: Three times a day (TID) | ORAL | Status: DC | PRN
  Administered 2023-03-02 – 2023-03-03 (×2): 10 mg via ORAL
  Filled 2023-03-02 (×2): qty 1

## 2023-03-02 MED ORDER — SODIUM CHLORIDE 0.9% FLUSH: 3.0000 mL | INTRAVENOUS | Status: DC | PRN

## 2023-03-02 MED ORDER — AZELASTINE HCL 0.1 % NA SOLN
1.0000 | Freq: Two times a day (BID) | NASAL | Status: DC
  Administered 2023-03-02 – 2023-03-03 (×2): 1 via NASAL
  Filled 2023-03-02 (×2): qty 30

## 2023-03-02 MED ORDER — ACETAMINOPHEN 650 MG RE SUPP: 650.0000 mg | RECTAL | Status: DC | PRN

## 2023-03-02 MED ORDER — BACITRACIN ZINC 500 UNIT/GM EX OINT
TOPICAL_OINTMENT | CUTANEOUS | Status: AC
  Filled 2023-03-02: qty 28.35

## 2023-03-02 SURGICAL SUPPLY — 70 items
APL SKNCLS STERI-STRIP NONHPOA (GAUZE/BANDAGES/DRESSINGS) ×1
BAG COUNTER SPONGE SURGICOUNT (BAG) ×1 IMPLANT
BAG SPNG CNTER NS LX DISP (BAG) ×1
BASKET BONE COLLECTION (BASKET) ×1 IMPLANT
BENZOIN TINCTURE PRP APPL 2/3 (GAUZE/BANDAGES/DRESSINGS) ×1 IMPLANT
BLADE CLIPPER SURG (BLADE) IMPLANT
BUR MATCHSTICK NEURO 3.0 LAGG (BURR) ×1 IMPLANT
BUR PRECISION FLUTE 6.0 (BURR) ×1 IMPLANT
CAGE ALTERA 10X31X9-13 15D (Cage) IMPLANT
CANISTER SUCT 3000ML PPV (MISCELLANEOUS) ×1 IMPLANT
CAP LOCK DLX THRD (Cap) IMPLANT
CNTNR URN SCR LID CUP LEK RST (MISCELLANEOUS) ×1 IMPLANT
CONT SPEC 4OZ STRL OR WHT (MISCELLANEOUS) ×1
COVER BACK TABLE 60X90IN (DRAPES) ×1 IMPLANT
DRAPE C-ARM 42X72 X-RAY (DRAPES) ×2 IMPLANT
DRAPE HALF SHEET 40X57 (DRAPES) ×1 IMPLANT
DRAPE LAPAROTOMY 100X72X124 (DRAPES) ×1 IMPLANT
DRAPE SURG 17X23 STRL (DRAPES) ×4 IMPLANT
DRSG OPSITE POSTOP 4X6 (GAUZE/BANDAGES/DRESSINGS) ×1 IMPLANT
ELECT BLADE 4.0 EZ CLEAN MEGAD (MISCELLANEOUS) ×1
ELECT REM PT RETURN 9FT ADLT (ELECTROSURGICAL) ×1
ELECTRODE BLDE 4.0 EZ CLN MEGD (MISCELLANEOUS) ×1 IMPLANT
ELECTRODE REM PT RTRN 9FT ADLT (ELECTROSURGICAL) ×1 IMPLANT
EVACUATOR 1/8 PVC DRAIN (DRAIN) IMPLANT
GAUZE 4X4 16PLY ~~LOC~~+RFID DBL (SPONGE) ×1 IMPLANT
GLOVE BIO SURGEON STRL SZ 6 (GLOVE) ×1 IMPLANT
GLOVE BIO SURGEON STRL SZ8 (GLOVE) ×2 IMPLANT
GLOVE BIO SURGEON STRL SZ8.5 (GLOVE) ×2 IMPLANT
GLOVE BIOGEL PI IND STRL 6.5 (GLOVE) ×1 IMPLANT
GLOVE EXAM NITRILE XL STR (GLOVE) IMPLANT
GOWN STRL REUS W/ TWL LRG LVL3 (GOWN DISPOSABLE) ×1 IMPLANT
GOWN STRL REUS W/ TWL XL LVL3 (GOWN DISPOSABLE) ×2 IMPLANT
GOWN STRL REUS W/TWL 2XL LVL3 (GOWN DISPOSABLE) IMPLANT
GOWN STRL REUS W/TWL LRG LVL3 (GOWN DISPOSABLE) ×1
GOWN STRL REUS W/TWL XL LVL3 (GOWN DISPOSABLE) ×2
HEMOSTAT POWDER KIT SURGIFOAM (HEMOSTASIS) ×1 IMPLANT
KIT BASIN OR (CUSTOM PROCEDURE TRAY) ×1 IMPLANT
KIT GRAFTMAG DEL NEURO DISP (NEUROSURGERY SUPPLIES) IMPLANT
KIT POSITION SURG JACKSON T1 (MISCELLANEOUS) ×1 IMPLANT
KIT TURNOVER KIT B (KITS) ×1 IMPLANT
NDL HYPO 21X1.5 SAFETY (NEEDLE) IMPLANT
NEEDLE HYPO 21X1.5 SAFETY (NEEDLE) IMPLANT
NEEDLE HYPO 22GX1.5 SAFETY (NEEDLE) ×1 IMPLANT
NS IRRIG 1000ML POUR BTL (IV SOLUTION) ×1 IMPLANT
PACK LAMINECTOMY NEURO (CUSTOM PROCEDURE TRAY) ×1 IMPLANT
PAD ARMBOARD 7.5X6 YLW CONV (MISCELLANEOUS) ×3 IMPLANT
PATTIES SURGICAL .5 X1 (DISPOSABLE) IMPLANT
PUTTY DBM 5CC CALC GRAN (Putty) IMPLANT
ROD CREO DLX CVD 6.35X40 (Rod) IMPLANT
ROD CURVED TI 6.35X40 (Rod) ×2 IMPLANT
SCREW PA DLX CREO 7.5X50 (Screw) IMPLANT
SCREW PA DLX CREO 7.5X55 (Screw) IMPLANT
SOL ELECTROSURG ANTI STICK (MISCELLANEOUS) ×1
SOLUTION ELECTROSURG ANTI STCK (MISCELLANEOUS) ×1 IMPLANT
SOLUTION IRRIG SURGIPHOR (IV SOLUTION) ×1 IMPLANT
SPIKE FLUID TRANSFER (MISCELLANEOUS) ×1 IMPLANT
SPONGE NEURO XRAY DETECT 1X3 (DISPOSABLE) IMPLANT
SPONGE SURGIFOAM ABS GEL 100 (HEMOSTASIS) IMPLANT
SPONGE T-LAP 4X18 ~~LOC~~+RFID (SPONGE) IMPLANT
STRIP CLOSURE SKIN 1/2X4 (GAUZE/BANDAGES/DRESSINGS) ×1 IMPLANT
SUT PROLENE 5 0 C1 (SUTURE) IMPLANT
SUT PROLENE 6 0 BV (SUTURE) IMPLANT
SUT VIC AB 1 CT1 18XBRD ANBCTR (SUTURE) ×2 IMPLANT
SUT VIC AB 1 CT1 8-18 (SUTURE) ×2
SUT VIC AB 2-0 CP2 18 (SUTURE) ×2 IMPLANT
SYR 20ML LL LF (SYRINGE) IMPLANT
TOWEL GREEN STERILE (TOWEL DISPOSABLE) ×1 IMPLANT
TOWEL GREEN STERILE FF (TOWEL DISPOSABLE) ×1 IMPLANT
TRAY FOLEY MTR SLVR 16FR STAT (SET/KITS/TRAYS/PACK) ×1 IMPLANT
WATER STERILE IRR 1000ML POUR (IV SOLUTION) ×1 IMPLANT

## 2023-03-02 NOTE — Anesthesia Procedure Notes (Signed)
Procedure Name: Intubation Date/Time: 03/02/2023 2:00 PM  Performed by: Laruth Bouchard., CRNAPre-anesthesia Checklist: Patient identified, Emergency Drugs available, Suction available, Patient being monitored and Timeout performed Patient Re-evaluated:Patient Re-evaluated prior to induction Oxygen Delivery Method: Circle system utilized Preoxygenation: Pre-oxygenation with 100% oxygen Induction Type: IV induction Ventilation: Mask ventilation without difficulty and Oral airway inserted - appropriate to patient size Laryngoscope Size: Mac and 3 Grade View: Grade II Tube type: Oral Tube size: 7.0 mm Number of attempts: 1 Airway Equipment and Method: Stylet Placement Confirmation: ETT inserted through vocal cords under direct vision, positive ETCO2 and breath sounds checked- equal and bilateral Secured at: 22 cm Tube secured with: Tape Dental Injury: Teeth and Oropharynx as per pre-operative assessment

## 2023-03-02 NOTE — Transfer of Care (Signed)
Immediate Anesthesia Transfer of Care Note  Patient: Kristen Singh  Procedure(s) Performed: LUMBAR FOUR-FIVE POSTERIOR LUMBAR INTERBODY FUSION (Spine Lumbar)  Patient Location: PACU  Anesthesia Type:General  Level of Consciousness: awake, drowsy, and patient cooperative  Airway & Oxygen Therapy: Patient Spontanous Breathing and Patient connected to nasal cannula oxygen  Post-op Assessment: Report given to RN and Post -op Vital signs reviewed and stable  Post vital signs: Reviewed and stable  Last Vitals: SEE PACU VITALS Vitals Value Taken Time  BP    Temp    Pulse    Resp    SpO2 93     Last Pain:  Vitals:   03/02/23 1306  TempSrc:   PainSc: 0-No pain         Complications: No notable events documented.

## 2023-03-02 NOTE — H&P (Signed)
Subjective: The patient is an 82 year old white female who has complained of back and left greater then right back and leg consistent with neurogenic claudication/lumbar radiculopathy.  She has failed medical management.  She was worked up with lumbar x-rays and lumbar MRI which demonstrated lumbar spine listhesis, spinal stenosis, etc.  I discussed the various treatment options with her.  She has decided proceed with surgery.  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   Atypical mole 03/29/2017   right medial shoulder/mild   Basal cell carcinoma 04/10/2019   left lower pretibial   Carpal tunnel syndrome    Cortical cataract    DDD (degenerative disc disease), cervical    cervical   DDD (degenerative disc disease), lumbar    lumbar   Depression    DOE (dyspnea on exertion)    Dyspnea    with exertion   GERD (gastroesophageal reflux disease)    Headache    Hx - no current problems   History of kidney stones    passed stones, no surgery   HLD (hyperlipidemia)    Hypertension    Migraines    Hx - no current problems   Osteopenia     Past Surgical History:  Procedure Laterality Date   BREAST BIOPSY Right 02/21/2020   affirm bx calcs, x marker, mild stromal fibrosis, benign, no atypia   BREAST BIOPSY Right 04/06/2021   right breast UIQ calcs, coil marker --> path showed COLUMNAR CELL CHANGE, FIBROADENOMATOID CHANGES, SCLEROSING ADENOSIS, LOBULAR NEOPLASIA   BREAST BIOPSY Right 04/06/2021   UIQ calcs, ribbon marker --> path showed FOCAL ATYPICAL DUCTAL HYPERPLASIA (ADH) IN A BACKGROUND OF COLUMNAR CELL CHANGE WITH USUAL DUCTAL HYPERPLASIA, APOCRINE METAPLASIA, SCLEROSING ADENOSIS, LOBULAR NEOPLASIA   CATARACT EXTRACTION, BILATERAL N/A    CHOLECYSTECTOMY     COLONOSCOPY     x several   EXCISION OF BREAST BIOPSY Right 05/08/2021   Procedure: EXCISION OF  BREAST BIOPSY W/ Needle localization;  Surgeon: Carolan Shiver, MD;  Location: [facility] ORS;  Service: General;  Laterality: Right;   TONSILLECTOMY     UPPER GI ENDOSCOPY     x several   WISDOM TOOTH EXTRACTION      Allergies  Allergen Reactions   Codeine Itching    Social History   Tobacco Use   Smoking status: Never    Passive exposure: Never   Smokeless tobacco: Never  Substance Use Topics   Alcohol use: Not Currently    Family History  Problem Relation Age of Onset   Breast cancer Sister 85   Breast cancer Maternal Grandmother        104 or 11   Breast cancer Sister 96   Prior to Admission medications   Medication Sig Start Date End Date Taking? Authorizing Provider  acetaminophen (TYLENOL) 500 MG tablet Take 1,000 mg by mouth every 8 (eight) hours as needed for moderate pain.   Yes [provider]  alendronate (FOSAMAX) 70 MG tablet Take 70 mg by mouth every Friday. 02/16/20  Yes [provider]  aspirin 81 MG chewable tablet Chew 81 mg by mouth every evening.   Yes [provider]  azelastine (ASTELIN) 0.1 % nasal spray Place 1 spray into both nostrils 2 (two) times daily. Use in each nostril as directed   Yes [provider]  Calcium Citrate-Vitamin D (  CALCIUM + D PO) Take 2 tablets by mouth daily.   Yes [provider]  clobetasol (TEMOVATE) 0.05 % external solution Apply to aa scalp qd up to 5 days a week prn itchy flares, avoid face, groin, axilla 05/17/22  Yes Willeen Niece, MD  DULoxetine (CYMBALTA) 60 MG capsule Take 60 mg by mouth daily. 05/12/18  Yes [provider]  fexofenadine (ALLEGRA) 180 MG tablet Take 180 mg by mouth every evening.   Yes [provider]  folic acid (FOLVITE) 800 MCG tablet Take 800 mcg by mouth daily.   Yes [provider]  gabapentin (NEURONTIN) 800 MG tablet Take 800 mg by mouth 2 (two) times daily. 10/05/18  Yes [provider]  ketoconazole (NIZORAL) 2 %  cream Apply 1 Application topically daily. Qd to scaly areas face, eyebrows as needed Patient taking differently: Apply 1 Application topically daily as needed for irritation. 05/17/22 07/11/23 Yes Willeen Niece, MD  ketotifen (ALAWAY) 0.035 % ophthalmic solution Place 1 drop into both eyes at bedtime.   Yes [provider]  metoprolol tartrate (LOPRESSOR) 50 MG tablet Take 50 mg by mouth 2 (two) times daily. 08/01/19 02/17/23 Yes [provider]  metroNIDAZOLE (METROCREAM) 0.75 % cream For rosacea apply a thin coat to the face QHS. 05/17/22  Yes Willeen Niece, MD  Multiple Vitamin (MULTIVITAMIN WITH MINERALS) TABS tablet Take 1 tablet by mouth daily.   Yes [provider]  omeprazole (PRILOSEC) 20 MG capsule Take 1 tablet by mouth daily. 01/07/20  Yes [provider]  pravastatin (PRAVACHOL) 40 MG tablet TAKE 1 TABLET BY MOUTH EVERY DAY AT NIGHT 10/12/18  Yes [provider]  sodium chloride (OCEAN) 0.65 % SOLN nasal spray Place 1 spray into both nostrils as needed (dryness).   Yes [provider]  Sodium Chloride-Xylitol (XLEAR SINUS CARE SPRAY NA) Place 1 spray into the nose daily as needed (congestion).   Yes [provider]  spironolactone (ALDACTONE) 25 MG tablet Take 25 mg by mouth daily. 02/10/23 02/10/24 Yes [provider]  traMADol (ULTRAM) 50 MG tablet Take 50 mg by mouth 2 (two) times daily as needed for moderate pain. 01/10/23  Yes [provider]  docusate sodium (COLACE) 50 MG capsule Take 50-100 mg by mouth daily as needed for mild constipation.    [provider]     Review of Systems  Positive ROS: As above  All other systems have been reviewed and were otherwise negative with the exception of those mentioned in the HPI and as above.  Objective: Vital signs in last 24 hours: Temp:  [98.2 F (36.8 C)] 98.2 F (36.8 C) (05/08 1159) Pulse Rate:  [72] 72 (05/08 1159) Resp:  [18] 18 (05/08  1159) BP: (160)/(85) 160/85 (05/08 1159) SpO2:  [96 %] 96 % (05/08 1159) Weight:  [71.7 kg] 71.7 kg (05/08 1159) Estimated body mass index is 28.9 kg/m as calculated from the following:   Height as of this encounter: 5\' 2"  (1.575 m).   Weight as of this encounter: 71.7 kg.   General Appearance: Alert Head: Normocephalic, without obvious abnormality, atraumatic Eyes: PERRL, conjunctiva/corneas clear, EOM's intact,    Ears: Normal  Throat: Normal  Neck: Supple, Back: unremarkable Lungs: Clear to auscultation bilaterally, respirations unlabored Heart: Regular rate and rhythm, no murmur, rub or gallop Abdomen: Soft, non-tender Extremities: Extremities normal, atraumatic, no cyanosis or edema Skin: unremarkable  NEUROLOGIC:   Mental status: alert and oriented,Motor Exam - grossly normal Sensory Exam -  grossly normal Reflexes:  Coordination - grossly normal Gait - grossly normal Balance - grossly normal Cranial Nerves: I: smell Not tested  II: visual acuity  OS: Normal  OD: Normal   II: visual fields Full to confrontation  II: pupils Equal, round, reactive to light  III,VII: ptosis None  III,IV,VI: extraocular muscles  Full ROM  V: mastication Normal  V: facial light touch sensation  Normal  V,VII: corneal reflex  Present  VII: facial muscle function - upper  Normal  VII: facial muscle function - lower Normal  VIII: hearing Not tested  IX: soft palate elevation  Normal  IX,X: gag reflex Present  XI: trapezius strength  5/5  XI: sternocleidomastoid strength 5/5  XI: neck flexion strength  5/5  XII: tongue strength  Normal    Data Review No results found for: "WBC", "HGB", "HCT", "MCV", "PLT" No results found for: "NA", "K", "CL", "CO2", "BUN", "CREATININE", "GLUCOSE" No results found for: "INR", "PROTIME"  Assessment/Plan: Lumbar spondylolisthesis, lumbar facet neuropathy, lumbar spinal stenosis, lumbago, neurogenic claudication, lumbar radiculopathy: I have  discussed situation with the patient.  I reviewed her imaging studies with her and pointed out the abnormalities.  We have discussed the various treatment options including surgery.  I have described the surgical treatment option of an L4-5 decompression, instrumentation and fusion.  I have shown her surgical models.  I have given her a surgical pamphlet.  We have discussed the risk, benefits, alternatives, expected postop course, and likelihood of achieving our goals with surgery.  I have answered all her questions.  She has decided proceed with surgery.   Cristi Loron 03/02/2023 1:39 PM

## 2023-03-02 NOTE — Progress Notes (Signed)
Orthopedic Tech Progress Note Patient Details:  [place] 1941/03/28 [id number]  Ortho Devices Type of Ortho Device: Lumbar corsett Ortho Device/Splint Location: BACK Ortho Device/Splint Interventions: Ordered   Post Interventions Patient Tolerated: Well Instructions Provided: Adjustment of device, Care of device  Love Milbourne L [place] Bor[place]t 03/02/2023, 7:12 PM

## 2023-03-02 NOTE — Anesthesia Postprocedure Evaluation (Signed)
Anesthesia Post Note  Patient: Kristen Singh  Procedure(s) Performed: LUMBAR FOUR-FIVE POSTERIOR LUMBAR INTERBODY FUSION (Spine Lumbar)     Patient location during evaluation: PACU Anesthesia Type: General Level of consciousness: awake and alert Pain management: pain level controlled Vital Signs Assessment: post-procedure vital signs reviewed and stable Respiratory status: spontaneous breathing, nonlabored ventilation, respiratory function stable and patient connected to nasal cannula oxygen Cardiovascular status: blood pressure returned to baseline and stable Postop Assessment: no apparent nausea or vomiting Anesthetic complications: no  No notable events documented.  Last Vitals:  Vitals:   03/02/23 1159 03/02/23 1730  BP: (!) 160/85 125/67  Pulse: 72 73  Resp: 18 11  Temp: 36.8 C 36.7 C  SpO2: 96% 93%    Last Pain:  Vitals:   03/02/23 1730  TempSrc:   PainSc: 0-No pain                 [name]

## 2023-03-02 NOTE — Op Note (Signed)
Brief history: The patient is a 82 year old white female who has complained of back and bilateral leg pain consistent with neurogenic claudication.  She has failed medical management and was worked up with lumbar x-rays and lumbar MRI which demonstrated an L4-5 listhesis and severe spinal stenosis.  I discussed the various treatment options with her.  She has decided proceed with surgery.  Preoperative diagnosis: Lumbar spondylolisthesis, degenerative disc disease, spinal stenosis compressing both the L4 and the L5 nerve roots; lumbago; lumbar radiculopathy; neurogenic claudication  Postoperative diagnosis: Same  Procedure: Bilateral L4-5 laminotomy/foraminotomies/medial facetectomy to decompress the bilateral L4 and L5 nerve roots(the work required to do this was in addition to the work required to do the posterior lumbar interbody fusion because of the patient's spinal stenosis, facet arthropathy. Etc. requiring a wide decompression of the nerve roots.);  Left L4-5 transforaminal lumbar interbody fusion with local morselized autograft bone and Zimmer DBM; insertion of interbody prosthesis at L4-5 (globus peek expandable interbody prosthesis); posterior nonsegmental instrumentation from L4 to L5 with globus titanium pedicle screws and rods; posterior lateral arthrodesis at L4-5 with local morselized autograft bone and Zimmer DBM.  Surgeon: Dr. Delma Officer  Asst.: Hildred Priest, NP  Anesthesia: Gen. endotracheal  Estimated blood loss: 200 cc  Drains: None  Complications: None  Description of procedure: The patient was brought to the operating room by the anesthesia team. General endotracheal anesthesia was induced. The patient was turned to the prone position on the Wilson frame. The patient's lumbosacral region was then prepared with Betadine scrub and Betadine solution. Sterile drapes were applied.  I then injected the area to be incised with Marcaine with epinephrine solution. I then used  the scalpel to make a linear midline incision over the L4-5 interspace. I then used electrocautery to perform a bilateral subperiosteal dissection exposing the spinous process and lamina of L4-5. We then obtained intraoperative radiograph to confirm our location. We then inserted the Verstrac retractor to provide exposure.  I began the decompression by using the high speed drill to perform laminotomies at L4-5 bilaterally. We then used the Kerrison punches to widen the laminotomy and removed the ligamentum flavum at L4-5 bilaterally. We used the Kerrison punches to remove the medial facets at L4-5 bilaterally, we removed the left L4-5 facet. We performed wide foraminotomies about the bilateral L4 and L5 nerve roots completing the decompression.  We now turned our attention to the posterior lumbar interbody fusion. I used a scalpel to incise the intervertebral disc at L4-5 bilaterally. I then performed a partial intervertebral discectomy at L4-5 bilaterally using the pituitary forceps. We prepared the vertebral endplates at L4-5 bilaterally for the fusion by removing the soft tissues with the curettes. We then used the trial spacers to pick the appropriate sized interbody prosthesis. We prefilled his prosthesis with a combination of local morselized autograft bone that we obtained during the decompression as well as Zimmer DBM. We inserted the prefilled prosthesis into the interspace at L4-5 from the left, we then turned and expanded the prosthesis. There was a good snug fit of the prosthesis in the interspace. We then filled and the remainder of the intervertebral disc space with local morselized autograft bone and Zimmer DBM. This completed the posterior lumbar interbody arthrodesis.  During the decompression and insertion of the prosthesis the assistant protected the thecal sac and nerve roots with the D'Errico retractor.  We now turned attention to the instrumentation. Under fluoroscopic guidance we  cannulated the bilateral L4 and L5 pedicles with  the bone probe. We then removed the bone probe. We then tapped the pedicle with a 6.5 millimeter tap. We then removed the tap. We probed inside the tapped pedicle with a ball probe to rule out cortical breaches. We then inserted a 7.5 x 50 and 55 millimeter pedicle screw into the L4 and L5 pedicles bilaterally under fluoroscopic guidance. We then palpated along the medial aspect of the pedicles to rule out cortical breaches. There were none. The nerve roots were not injured. We then connected the unilateral pedicle screws with a lordotic rod. We compressed the construct and secured the rod in place with the caps. We then tightened the caps appropriately. This completed the instrumentation from L4-5 bilaterally.  We now turned our attention to the posterior lateral arthrodesis at L4-5. We used the high-speed drill to decorticate the remainder of the facets, pars, transverse process at L4-5. We then applied a combination of local morselized autograft bone and Zimmer DBM over these decorticated posterior lateral structures. This completed the posterior lateral arthrodesis.  We then obtained hemostasis using bipolar electrocautery. We irrigated the wound out with Betadine solution. We inspected the thecal sac and nerve roots and noted they were well decompressed. We then removed the retractor.  We injected Exparel . We reapproximated patient's thoracolumbar fascia with interrupted #1 Vicryl suture. We reapproximated patient's subcutaneous tissue with interrupted 2-0 Vicryl suture. The reapproximated patient's skin with Steri-Strips and benzoin. The wound was then coated with bacitracin ointment. A sterile dressing was applied. The drapes were removed. The patient was subsequently returned to the supine position where they were extubated by the anesthesia team. He was then transported to the post anesthesia care unit in stable condition. All sponge instrument and  needle counts were reportedly correct at the end of this case.

## 2023-03-03 DIAGNOSIS — M48062 Spinal stenosis, lumbar region with neurogenic claudication: Secondary | ICD-10-CM | POA: Diagnosis not present

## 2023-03-03 MED ORDER — OXYCODONE-ACETAMINOPHEN 5-325 MG PO TABS: 1.0000 | ORAL_TABLET | ORAL | 0 refills | Status: AC | PRN

## 2023-03-03 MED ORDER — CYCLOBENZAPRINE HCL 5 MG PO TABS: 10.0000 mg | ORAL_TABLET | Freq: Three times a day (TID) | ORAL | 0 refills | Status: AC | PRN

## 2023-03-03 NOTE — Discharge Instructions (Signed)
Wound Care Keep incision covered and dry until post op day 3. You may remove the Honeycomb dressing on post op day 3. Leave steri-strips on back.  They will fall off by themselves. Do not put any creams, lotions, or ointments on incision. You are fine to shower. Let water run over incision and pat dry.  Activity Walk each and every day, increasing distance each day. No lifting greater than 5 lbs.  Avoid excessive back motion. No driving for 2 weeks; may ride as a passenger locally.  Diet Resume your normal diet.  Call Your Doctor If Any of These Occur Redness, drainage, or swelling at the wound.  Temperature greater than 101 degrees. Severe pain not relieved by pain medication. Incision starts to come apart.  Follow Up Appt Call [phone] today for appointment in 3-4 weeks if you don't already have one or for any problems.  If you have any hardware placed in your spine, you will need an x-ray before your appointment.

## 2023-03-03 NOTE — Evaluation (Signed)
Occupational Therapy Evaluation Patient Details Name: Kristen Singh MRN: [id number] DOB: 1941-08-28 Today's Date: 03/03/2023   History of Present Illness 82 y.o. female s/p L4-5 PLIF. PMH significant for arthritis, carpal tunnel syndrome, DDD, depression, cataract, GERD, HLD, osteopenia.   Clinical Impression   PTA, pt independent and living with her husband in their town home. Upon eval, pt performing BADL with mod I. Pt initially unaware of all precautions and requiring significant education regarding proiper body mechanics, mobility within precautions, and proper uses of DME. After initial education, pt educated and demonstrating use of compensatory techniques for UB ADL, LB ADL, bed mobility, shower transfers, toielting within precautions. All education provided and questions answered. No further acute OT needs. OT to sign off. Thank you for this order.      Recommendations for follow up therapy are one component of a multi-disciplinary discharge planning process, led by the attending physician.  Recommendations may be updated based on patient status, additional functional criteria and insurance authorization.   Assistance Recommended at Discharge PRN  Patient can return home with the following Other (comment);Assist for transportation;Direct supervision/assist for financial management;Assistance with cooking/housework (on pt request)    Functional Status Assessment  Patient has had a recent decline in their functional status and demonstrates the ability to make significant improvements in function in a reasonable and predictable amount of time.  Equipment Recommendations  None recommended by OT    Recommendations for Other Services       Precautions / Restrictions Precautions Precautions: Back Precaution Booklet Issued: Yes (comment) Precaution Comments: All precautions reviewed within the context of ADL Required Braces or Orthoses: Spinal Brace Spinal Brace: Lumbar  corset Restrictions Weight Bearing Restrictions: No      Mobility Bed Mobility Overal bed mobility: Needs Assistance Bed Mobility: Rolling, Sidelying to Sit, Sit to Sidelying Rolling: Supervision Sidelying to sit: Supervision     Sit to sidelying: Supervision General bed mobility comments: cues for technique.    Transfers Overall transfer level: Modified independent Equipment used: None, Quad cane               General transfer comment: Initially required cueing for technique      Balance Overall balance assessment: Modified Independent                                         ADL either performed or assessed with clinical judgement   ADL Overall ADL's : Modified independent                                       General ADL Comments: for increased time after initial education     Vision Ability to See in Adequate Light: 0 Adequate Patient Visual Report: No change from baseline Vision Assessment?: No apparent visual deficits Additional Comments: WFL for tasks assessed     Perception Perception Perception Tested?: No   Praxis Praxis Praxis tested?: Within functional limits    Pertinent Vitals/Pain Pain Assessment Pain Assessment: Faces Faces Pain Scale: Hurts a little bit Pain Location: operative site Pain Descriptors / Indicators: Discomfort Pain Intervention(s): Limited activity within patient's tolerance, Monitored during session     Hand Dominance     Extremity/Trunk Assessment     Lower Extremity Assessment Lower Extremity Assessment: Defer to PT evaluation   Cervical /  Trunk Assessment Cervical / Trunk Assessment: Back Surgery   Communication Communication Communication: No difficulties   Cognition Arousal/Alertness: Awake/alert Behavior During Therapy: WFL for tasks assessed/performed Overall Cognitive Status: Within Functional Limits for tasks assessed                                  General Comments: Initially requiring cues to implement precautions during mobility     General Comments  VSS. Pt benefitting from education regarding body mechanics, maintaining precautions, and knowledge of proper use of DME    Exercises     Shoulder Instructions      Home Living Family/patient expects to be discharged to:: Private residence Living Arrangements: Spouse/significant other Available Help at Discharge: Family Type of Home: Other(Comment) (town home) Home Access: Level entry     Home Layout: One level     Bathroom Shower/Tub: Walk-in shower (~4in lip)   Firefighter:  (comfort height)     Home Equipment: Shower seat - built in;Cane - quad          Prior Functioning/Environment Prior Level of Function : Independent/Modified Independent             Mobility Comments: cane ADLs Comments: Pt reports independent        OT Problem List: Decreased strength;Decreased activity tolerance;Impaired balance (sitting and/or standing)      OT Treatment/Interventions:      OT Goals(Current goals can be found in the care plan section) Acute Rehab OT Goals Patient Stated Goal: go home OT Goal Formulation: With patient  OT Frequency:      Co-evaluation              AM-PAC OT "6 Clicks" Daily Activity     Outcome Measure Help from another person eating meals?: None Help from another person taking care of personal grooming?: None Help from another person toileting, which includes using toliet, bedpan, or urinal?: None Help from another person bathing (including washing, rinsing, drying)?: None Help from another person to put on and taking off regular upper body clothing?: None Help from another person to put on and taking off regular lower body clothing?: None 6 Click Score: 24   End of Session Equipment Utilized During Treatment: Other (comment);Back brace (pt's cane) Nurse Communication: Mobility status  Activity Tolerance: Patient tolerated  treatment well Patient left: in bed;with call bell/phone within reach  OT Visit Diagnosis: Unsteadiness on feet (R26.81);Muscle weakness (generalized) (M62.81)                Time: 4742-5956 OT Time Calculation (min): 30 min Charges:  OT General Charges $OT Visit: 1 Visit OT Evaluation $OT Eval Low Complexity: 1 Low OT Treatments $Self Care/Home Management : 8-22 mins  Tyler Deis, OTR/L [facility] Acute Rehabilitation Office: [phone]   Kristen Singh 03/03/2023, 9:22 AM

## 2023-03-03 NOTE — Discharge Summary (Signed)
Physician Discharge Summary     Providing Compassionate, Quality Care - Together   Patient ID: Kristen Singh MRN: [id number] DOB/AGE: Oct 26, 1940 82 y.o.  Admit date: 03/02/2023 Discharge date: 03/03/2023  Admission Diagnoses: Spondylolisthesis of lumbar region  Discharge Diagnoses:  Principal Problem:   Spondylolisthesis of lumbar region   Discharged Condition: good  Hospital Course: Patient underwent an L4-5 posterior lumbar fusion by Dr. Lovell Sheehan on 03/02/2023. She was admitted to 3C04 following recovery from anesthesia in the PACU. Her postoperative course has been uncomplicated. She has worked with both physical and occupational therapies who feel the patient is ready for discharge home. She is ambulating independently and without difficulty. She is tolerating a normal diet. She is not having any bowel or bladder dysfunction. Her pain is well-controlled with oral pain medication. She is ready for discharge home.   Consults: PT/OT/TOC  Significant Diagnostic Studies: radiology: DG Lumbar Spine 2-3 Views  Result Date: 03/02/2023 CLINICAL DATA:  Elective surgery. EXAM: LUMBAR SPINE - 2-3 VIEW COMPARISON:  Preoperative radiograph FINDINGS: Three fluoroscopic spot views of the lumbar spine in frontal and lateral projections. Posterior intrapedicular screws with interbody spacer at L4-L5. Fluoroscopy time 16 seconds. Dose 11.61 mGy. IMPRESSION: Intraoperative fluoroscopy during lumbar fusion. Electronically Signed   By: Narda Rutherford M.D.   On: 03/02/2023 17:21   DG Lumbar Spine 1 View  Result Date: 03/02/2023 CLINICAL DATA:  [id number] Surgery, elective [id number] EXAM: LUMBAR SPINE - 1 VIEW COMPARISON:  Lumbar radiograph 01/24/2023. FINDINGS: Portable cross-table lateral view of the lumbar spine obtained in the operating room labeled 1. Surgical instrument localizes posteriorly just above the L4 spinous process. IMPRESSION: Surgical instrument localizes posteriorly just above the L4 spinous  process. Electronically Signed   By: Narda Rutherford M.D.   On: 03/02/2023 17:21   DG C-Arm 1-60 Min-No Report  Result Date: 03/02/2023 Fluoroscopy was utilized by the requesting physician.  No radiographic interpretation.     Treatments: surgery: Bilateral L4-5 laminotomy/foraminotomies/medial facetectomy to decompress the bilateral L4 and L5 nerve roots(the work required to do this was in addition to the work required to do the posterior lumbar interbody fusion because of the patient's spinal stenosis, facet arthropathy. Etc. requiring a wide decompression of the nerve roots.);  Left L4-5 transforaminal lumbar interbody fusion with local morselized autograft bone and Zimmer DBM; insertion of interbody prosthesis at L4-5 (globus peek expandable interbody prosthesis); posterior nonsegmental instrumentation from L4 to L5 with globus titanium pedicle screws and rods; posterior lateral arthrodesis at L4-5 with local morselized autograft bone and Zimmer DBM.   Discharge Exam: Blood pressure 120/66, pulse 94, temperature (!) 97.5 F (36.4 C), temperature source Oral, resp. rate 16, height 5\' 2"  (1.575 m), weight 71.7 kg, SpO2 90 %.  Per report:  Alert and oriented x 4 PERRLA CN II-XII grossly intact MAE, Strength and sensation intact Incision is covered with Honeycomb dressing and Steri Strips; Dressing is dry and intact, with a small amount of dried sanguinous drainage   Disposition: Discharge disposition: 01-Home or Self Care       Discharge Instructions     Call MD for:  difficulty breathing, headache or visual disturbances   Complete by: As directed    Call MD for:  hives   Complete by: As directed    Call MD for:  persistant nausea and vomiting   Complete by: As directed    Call MD for:  redness, tenderness, or signs of infection (pain, swelling, redness, odor or green/yellow discharge around incision  site)   Complete by: As directed    Call MD for:  severe uncontrolled pain    Complete by: As directed    Call MD for:  temperature >100.4   Complete by: As directed    Diet - low sodium heart healthy   Complete by: As directed    If the dressing is still on your incision site when you go home, remove it on the third day after your surgery date. Remove dressing if it begins to fall off, or if it is dirty or damaged before the third day.   Complete by: As directed    Increase activity slowly   Complete by: As directed       Allergies as of 03/03/2023       Reactions   Codeine Itching        Medication List     STOP taking these medications    traMADol 50 MG tablet Commonly known as: ULTRAM       TAKE these medications    acetaminophen 500 MG tablet Commonly known as: TYLENOL Take 1,000 mg by mouth every 8 (eight) hours as needed for moderate pain.   Alaway 0.035 % ophthalmic solution Generic drug: ketotifen Place 1 drop into both eyes at bedtime.   alendronate 70 MG tablet Commonly known as: FOSAMAX Take 70 mg by mouth every Friday.   aspirin 81 MG chewable tablet Chew 81 mg by mouth every evening.   azelastine 0.1 % nasal spray Commonly known as: ASTELIN Place 1 spray into both nostrils 2 (two) times daily. Use in each nostril as directed   CALCIUM + D PO Take 2 tablets by mouth daily.   clobetasol 0.05 % external solution Commonly known as: TEMOVATE Apply to aa scalp qd up to 5 days a week prn itchy flares, avoid face, groin, axilla   cyclobenzaprine 5 MG tablet Commonly known as: FLEXERIL Take 2 tablets (10 mg total) by mouth 3 (three) times daily as needed for muscle spasms.   docusate sodium 50 MG capsule Commonly known as: COLACE Take 50-100 mg by mouth daily as needed for mild constipation.   DULoxetine 60 MG capsule Commonly known as: CYMBALTA Take 60 mg by mouth daily.   fexofenadine 180 MG tablet Commonly known as: ALLEGRA Take 180 mg by mouth every evening.   folic acid 800 MCG tablet Commonly known as:  FOLVITE Take 800 mcg by mouth daily.   gabapentin 800 MG tablet Commonly known as: NEURONTIN Take 800 mg by mouth 2 (two) times daily.   ketoconazole 2 % cream Commonly known as: NIZORAL Apply 1 Application topically daily. Qd to scaly areas face, eyebrows as needed What changed:  when to take this reasons to take this additional instructions   metoprolol tartrate 50 MG tablet Commonly known as: LOPRESSOR Take 50 mg by mouth 2 (two) times daily.   metroNIDAZOLE 0.75 % cream Commonly known as: METROCREAM For rosacea apply a thin coat to the face QHS.   multivitamin with minerals Tabs tablet Take 1 tablet by mouth daily.   omeprazole 20 MG capsule Commonly known as: PRILOSEC Take 1 tablet by mouth daily.   oxyCODONE-acetaminophen 5-325 MG tablet Commonly known as: Percocet Take 1-2 tablets by mouth every 4 (four) hours as needed for severe pain.   pravastatin 40 MG tablet Commonly known as: PRAVACHOL TAKE 1 TABLET BY MOUTH EVERY DAY AT NIGHT   sodium chloride 0.65 % Soln nasal spray Commonly known as: OCEAN Place 1 spray  into both nostrils as needed (dryness).   spironolactone 25 MG tablet Commonly known as: ALDACTONE Take 25 mg by mouth daily.   XLEAR SINUS CARE SPRAY NA Place 1 spray into the nose daily as needed (congestion).               Discharge Care Instructions  (From admission, onward)           Start     Ordered   03/03/23 0000  If the dressing is still on your incision site when you go home, remove it on the third day after your surgery date. Remove dressing if it begins to fall off, or if it is dirty or damaged before the third day.        03/03/23 1003            Follow-up Information     Tressie Stalker, MD. Go on 03/25/2023.   Specialty: Neurosurgery Why: First post op appointment with x-rays is on 03/25/2023 at 8:30 AM. Contact information: [address] [place] [address] [phone]                  Signed: Val Eagle, DNP, AGNP-C Nurse Practitioner  [facility] [address], [address], [place], [place] [address] P: [phone]    F: [phone]  03/03/2023, 10:05 AM

## 2023-03-03 NOTE — Plan of Care (Signed)
Pt and husband given D/C instructions with verbal understanding. Rx's were sent to the pharmacy by MD. Pt's incision is clean and dry with no sign of infection. Pt's IV was removed prior to D/C. Pt D/C'd home via wheelchair per MD order. Pt is stable @ D/C and has no other needs at this time. Adalai Perl, RN 

## 2023-03-03 NOTE — Evaluation (Signed)
Physical Therapy Evaluation  Patient Details Name: Kristen Singh MRN: [id number] DOB: 1941/04/14 Today's Date: 03/03/2023  History of Present Illness  Pt is an 82 y.o. female s/p L4-5 PLIF on 03/02/2023. PMH significant for arthritis, carpal tunnel syndrome, DDD, depression, cataract, osteopenia.   Clinical Impression  Pt admitted with above diagnosis. At the time of PT eval, pt was able to demonstrate transfers and ambulation with supervision for safety to modified independence and cane for support. Pt was educated on precautions, brace application/wearing schedule, appropriate activity progression, and car transfer. Pt currently with functional limitations due to the deficits listed below (see PT Problem List). Pt will benefit from skilled PT to increase their independence and safety with mobility to allow discharge to the venue listed below.         Recommendations for follow up therapy are one component of a multi-disciplinary discharge planning process, led by the attending physician.  Recommendations may be updated based on patient status, additional functional criteria and insurance authorization.  Follow Up Recommendations       Assistance Recommended at Discharge PRN  Patient can return home with the following  A little help with walking and/or transfers;A little help with bathing/dressing/bathroom;Assistance with cooking/housework;Assist for transportation;Help with stairs or ramp for entrance    Equipment Recommendations None recommended by PT  Recommendations for Other Services       Functional Status Assessment Patient has had a recent decline in their functional status and demonstrates the ability to make significant improvements in function in a reasonable and predictable amount of time.     Precautions / Restrictions Precautions Precautions: Back Precaution Booklet Issued: Yes (comment) Precaution Comments: All precautions reviewed within the context of ADL Required  Braces or Orthoses: Spinal Brace Spinal Brace: Lumbar corset Restrictions Weight Bearing Restrictions: No      Mobility  Bed Mobility Overal bed mobility: Needs Assistance Bed Mobility: Rolling, Sidelying to Sit, Sit to Sidelying Rolling: Supervision Sidelying to sit: Supervision     Sit to sidelying: Supervision General bed mobility comments: Tactile cues for technique and optimal log roll. No physical assist required. HOB flat and rails lowered to simulate home environment.    Transfers Overall transfer level: Modified independent Equipment used: None, Quad cane ("Hurrycane" type cane)               General transfer comment: VC's for improved posture with stand.    Ambulation/Gait Ambulation/Gait assistance: Supervision Gait Distance (Feet): 400 Feet Assistive device: Quad cane ("hurrycane" type cane) Gait Pattern/deviations: Step-through pattern, Decreased stride length, Trunk flexed Gait velocity: Decreased Gait velocity interpretation: 1.31 - 2.62 ft/sec, indicative of limited community ambulator   General Gait Details: VC's for improved posture throughout gait training. No overt LOB noted. Inconsistent use of cane.  Stairs            Wheelchair Mobility    Modified Rankin (Stroke Patients Only)       Balance Overall balance assessment: Mild deficits observed, not formally tested                                           Pertinent Vitals/Pain Pain Assessment Pain Assessment: Faces Faces Pain Scale: Hurts a little bit Pain Location: operative site Pain Descriptors / Indicators: Discomfort Pain Intervention(s): Limited activity within patient's tolerance, Monitored during session, Repositioned    Home Living Family/patient expects to be discharged  to:: Private residence Living Arrangements: Spouse/significant other Available Help at Discharge: Family Type of Home: Other(Comment) (town home) Home Access: Level entry        Home Layout: One level Home Equipment: Information systems manager - built in;Cane - quad      Prior Function Prior Level of Function : Independent/Modified Independent             Mobility Comments: cane ADLs Comments: Pt reports independent     Hand Dominance        Extremity/Trunk Assessment   Upper Extremity Assessment Upper Extremity Assessment: Defer to OT evaluation    Lower Extremity Assessment Lower Extremity Assessment: Generalized weakness;RLE deficits/detail (Consistent with pre-op diagnosis) RLE Deficits / Details: Pt reports increased pain in R hip/glute s/p surgery    Cervical / Trunk Assessment Cervical / Trunk Assessment: Back Surgery  Communication   Communication: No difficulties  Cognition Arousal/Alertness: Awake/alert Behavior During Therapy: WFL for tasks assessed/performed Overall Cognitive Status: Within Functional Limits for tasks assessed                                          General Comments General comments (skin integrity, edema, etc.): VSS. Pt benefitting from education regarding body mechanics, maintaining precautions, and knowledge of proper use of DME    Exercises     Assessment/Plan    PT Assessment Patient needs continued PT services  PT Problem List Decreased strength;Decreased activity tolerance;Decreased balance;Decreased mobility;Decreased knowledge of use of DME;Decreased safety awareness;Decreased knowledge of precautions;Pain       PT Treatment Interventions DME instruction;Gait training;Functional mobility training;Therapeutic activities;Therapeutic exercise;Balance training;Patient/family education    PT Goals (Current goals can be found in the Care Plan section)  Acute Rehab PT Goals Patient Stated Goal: Home today, be able to exercise PT Goal Formulation: With patient Time For Goal Achievement: 03/10/23 Potential to Achieve Goals: Good    Frequency Min 5X/week     Co-evaluation                AM-PAC PT "6 Clicks" Mobility  Outcome Measure Help needed turning from your back to your side while in a flat bed without using bedrails?: A Little Help needed moving from lying on your back to sitting on the side of a flat bed without using bedrails?: A Little Help needed moving to and from a bed to a chair (including a wheelchair)?: A Little Help needed standing up from a chair using your arms (e.g., wheelchair or bedside chair)?: A Little Help needed to walk in hospital room?: A Little Help needed climbing 3-5 steps with a railing? : A Little 6 Click Score: 18    End of Session Equipment Utilized During Treatment: Gait belt;Back brace Activity Tolerance: Patient tolerated treatment well Patient left: in chair;with call bell/phone within reach Nurse Communication: Mobility status PT Visit Diagnosis: Unsteadiness on feet (R26.81);Pain Pain - Right/Left: Right Pain - part of body: Hip (back)    Time: 6213-0865 PT Time Calculation (min) (ACUTE ONLY): 20 min   Charges:   PT Evaluation $PT Eval Low Complexity: 1 Low          Conni Slipper, PT, DPT Acute Rehabilitation Services Secure Chat Preferred Office: [phone]   Marylynn Pearson 03/03/2023, 9:51 AM

## 2023-03-03 NOTE — Progress Notes (Signed)
Subjective: The patient is alert and pleasant.  She looks well.  She has no complaints.  She had no leg pain when she was walking.  Objective: Vital signs in last 24 hours: Temp:  [97.5 F (36.4 C)-98.2 F (36.8 C)] 97.5 F (36.4 C) (05/09 0720) Pulse Rate:  [65-96] 94 (05/09 0720) Resp:  [11-18] 16 (05/09 0720) BP: (117-160)/(66-85) 120/66 (05/09 0720) SpO2:  [90 %-96 %] 90 % (05/09 0720) Weight:  [71.7 kg] 71.7 kg (05/08 1159) Estimated body mass index is 28.9 kg/m as calculated from the following:   Height as of this encounter: 5\' 2"  (1.575 m).   Weight as of this encounter: 71.7 kg.   Intake/Output from previous day: 05/08 0701 - 05/09 0700 In: 2551 [P.O.:840; I.V.:1411; IV Piggyback:300] Out: 325 [Urine:175; Blood:150] Intake/Output this shift: No intake/output data recorded.  Physical exam the patient is alert and oriented.  Her strength is normal.  Her dressing has a small blood stain.  Lab Results: No results for input(s): "WBC", "HGB", "HCT", "PLT" in the last 72 hours. BMET No results for input(s): "NA", "K", "CL", "CO2", "GLUCOSE", "BUN", "CREATININE", "CALCIUM" in the last 72 hours.  Studies/Results: DG Lumbar Spine 2-3 Views  Result Date: 03/02/2023 CLINICAL DATA:  Elective surgery. EXAM: LUMBAR SPINE - 2-3 VIEW COMPARISON:  Preoperative radiograph FINDINGS: Three fluoroscopic spot views of the lumbar spine in frontal and lateral projections. Posterior intrapedicular screws with interbody spacer at L4-L5. Fluoroscopy time 16 seconds. Dose 11.61 mGy. IMPRESSION: Intraoperative fluoroscopy during lumbar fusion. Electronically Signed   By: Narda Rutherford M.D.   On: 03/02/2023 17:21   DG Lumbar Spine 1 View  Result Date: 03/02/2023 CLINICAL DATA:  [id number] Surgery, elective [id number] EXAM: LUMBAR SPINE - 1 VIEW COMPARISON:  Lumbar radiograph 01/24/2023. FINDINGS: Portable cross-table lateral view of the lumbar spine obtained in the operating room labeled 1. Surgical  instrument localizes posteriorly just above the L4 spinous process. IMPRESSION: Surgical instrument localizes posteriorly just above the L4 spinous process. Electronically Signed   By: Narda Rutherford M.D.   On: 03/02/2023 17:21   DG C-Arm 1-60 Min-No Report  Result Date: 03/02/2023 Fluoroscopy was utilized by the requesting physician.  No radiographic interpretation.    Assessment/Plan: Postop day #1: The patient is doing well.  She will likely go home later today after physical therapy.  I gave her discharge instructions and answered all her questions    LOS: 0 days     Cristi Loron 03/03/2023, 7:51 AM     Patient ID: Kristen Singh, female   DOB: July 16, 1941, 82 y.o.   MRN: [id number]

## 2023-03-16 MED FILL — Heparin Sodium (Porcine) Inj 1000 Unit/ML: INTRAMUSCULAR | Qty: 30 | Status: AC

## 2023-03-18 IMAGING — MG DIGITAL DIAGNOSTIC BILAT W/ TOMO W/ CAD
6 of 9 series · 6 of 25 positions shown · non-contrast
Comparison: Previous exam(s).

CLINICAL DATA: RIGHT lumpectomy performed 05/08/2021. Patient has
not had radiation or chemotherapy.

EXAM:
DIGITAL DIAGNOSTIC BILATERAL MAMMOGRAM WITH TOMOSYNTHESIS AND CAD
TECHNIQUE: Bilateral digital diagnostic mammography and breast tomosynthesis
was performed. The images were evaluated with computer-aided
detection.

[R MLO]
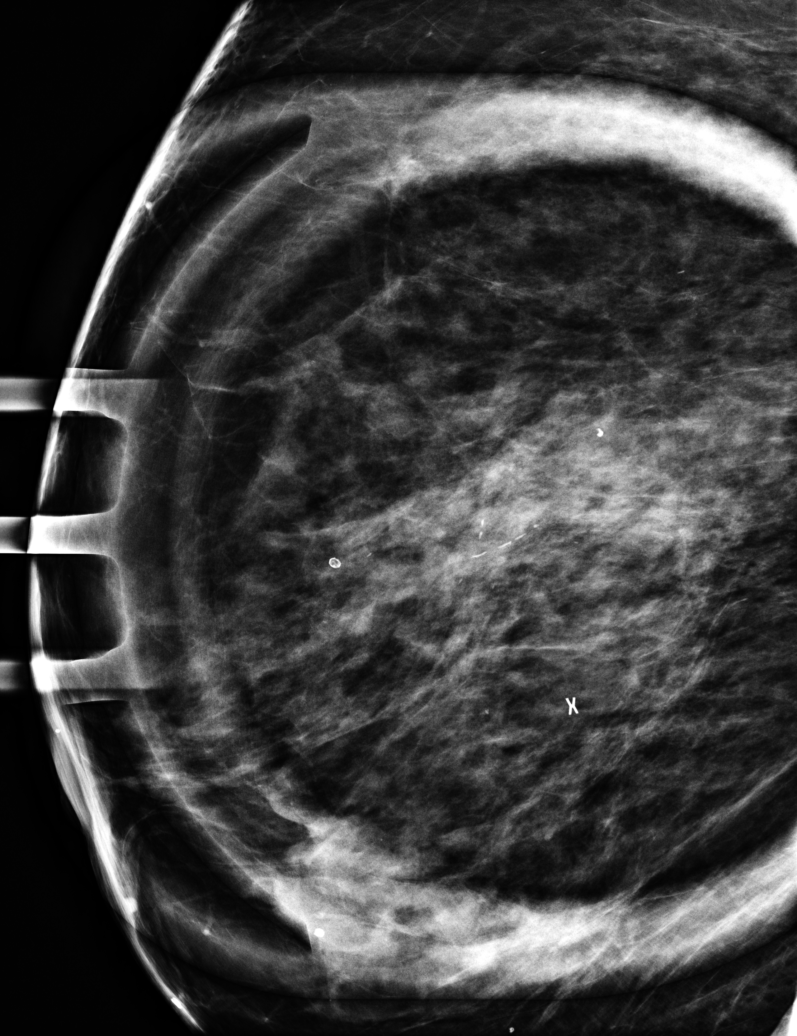

[R MLO synth-2D]
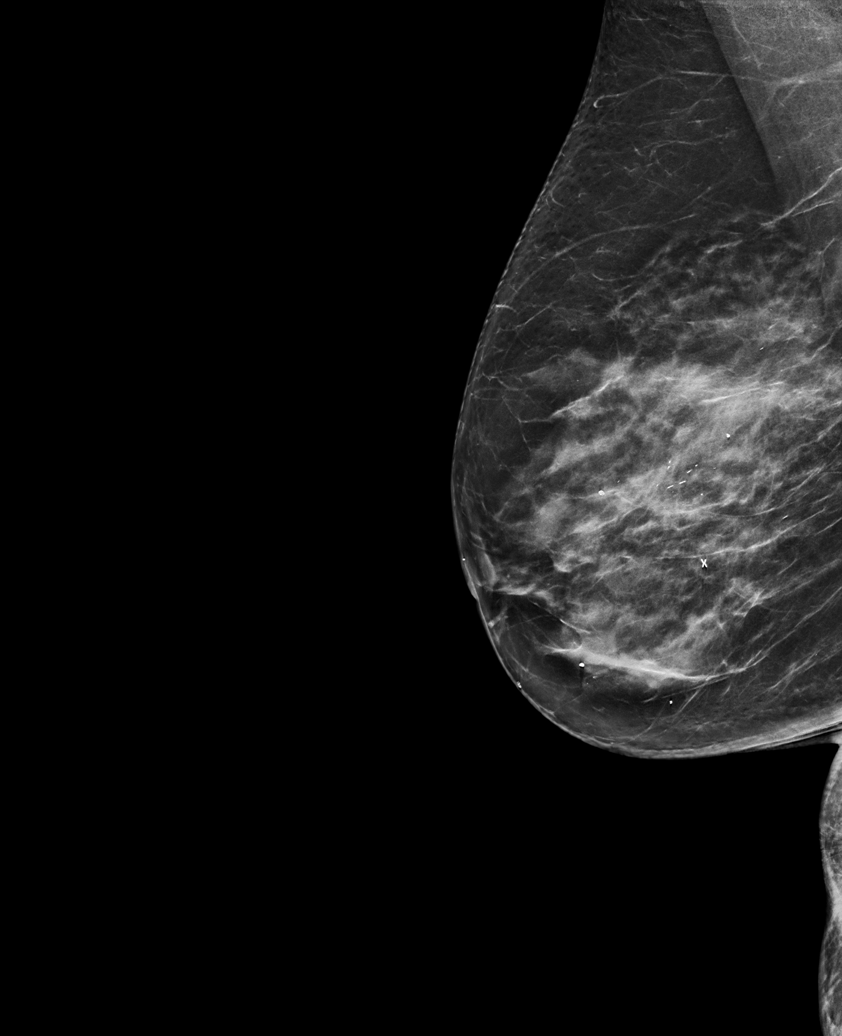

[L CC synth-2D]
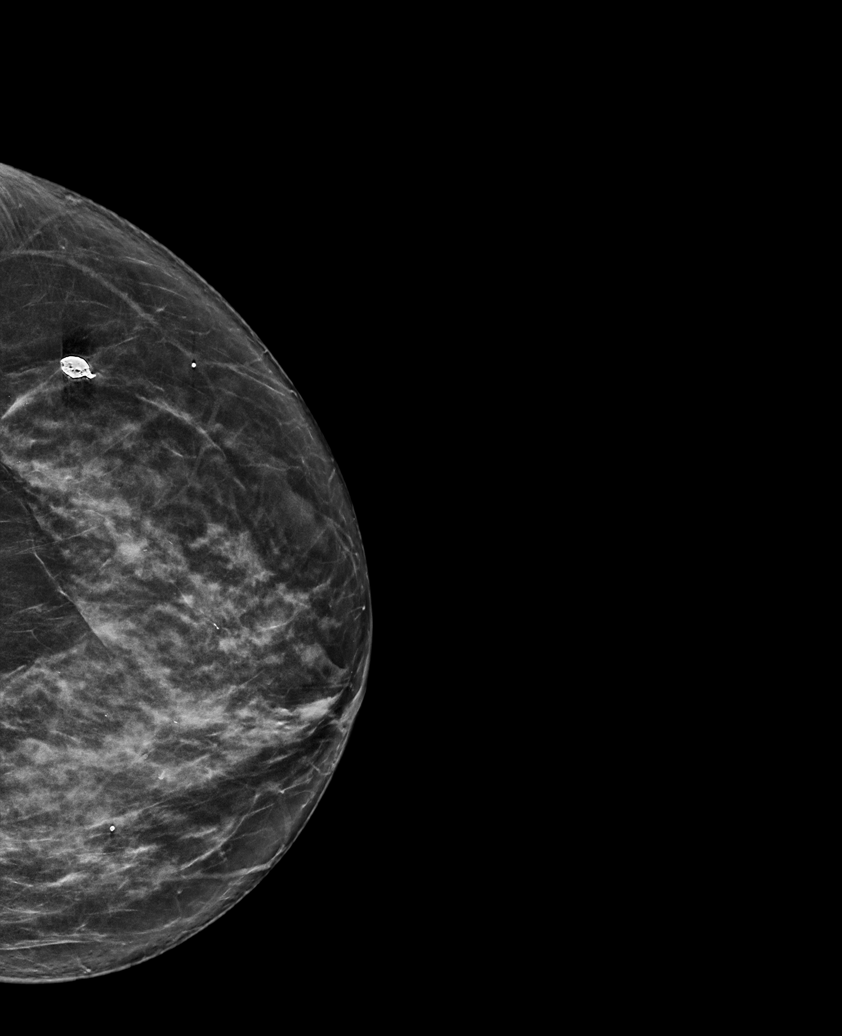

[L MLO synth-2D]
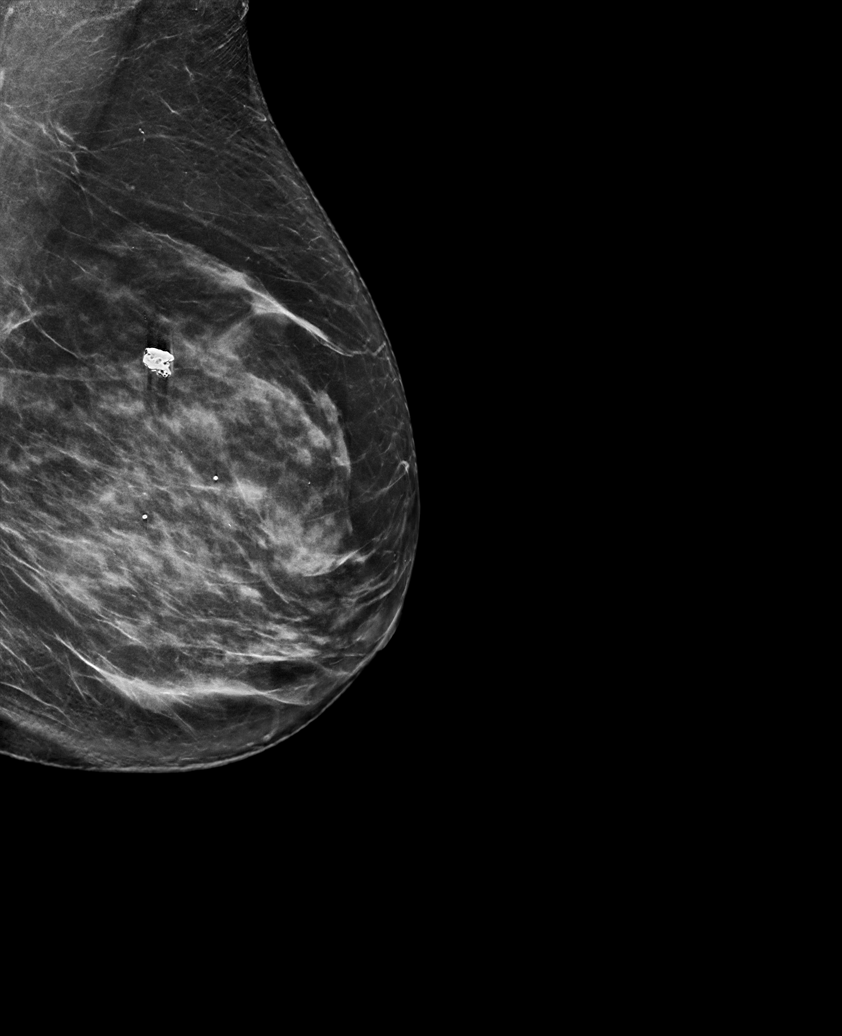

[R CC synth-2D]
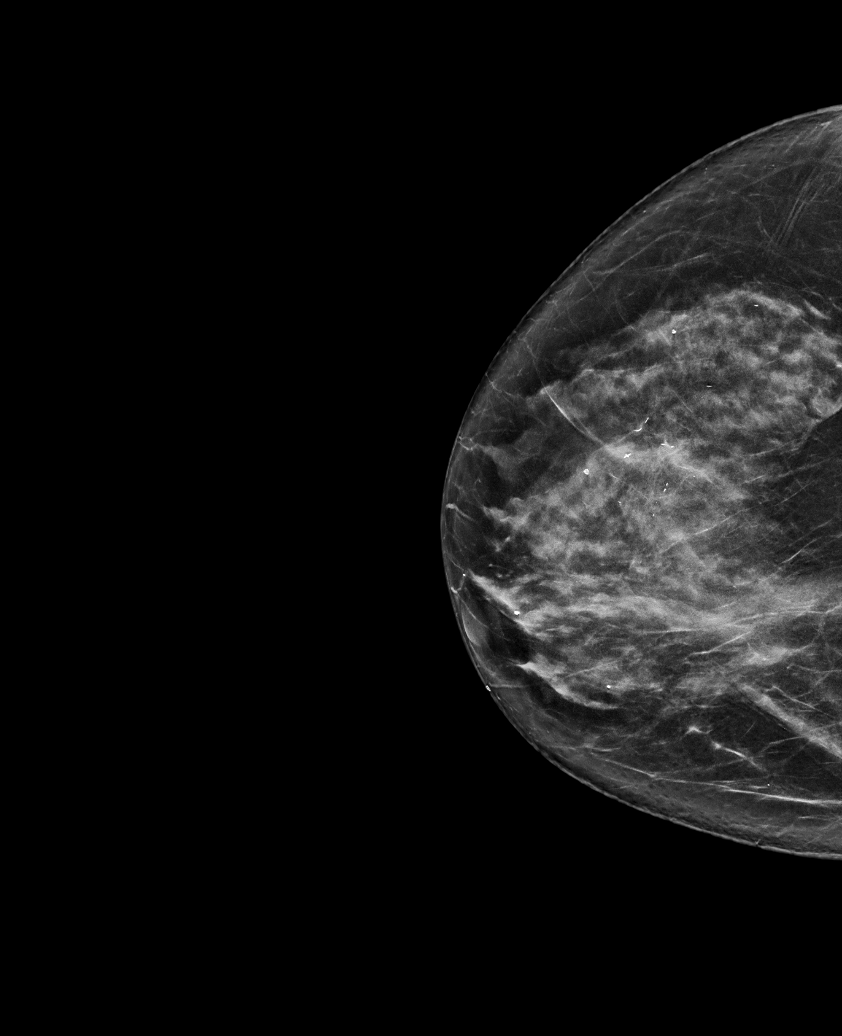

[L MLO tomo · tomo slice 40/79.0]
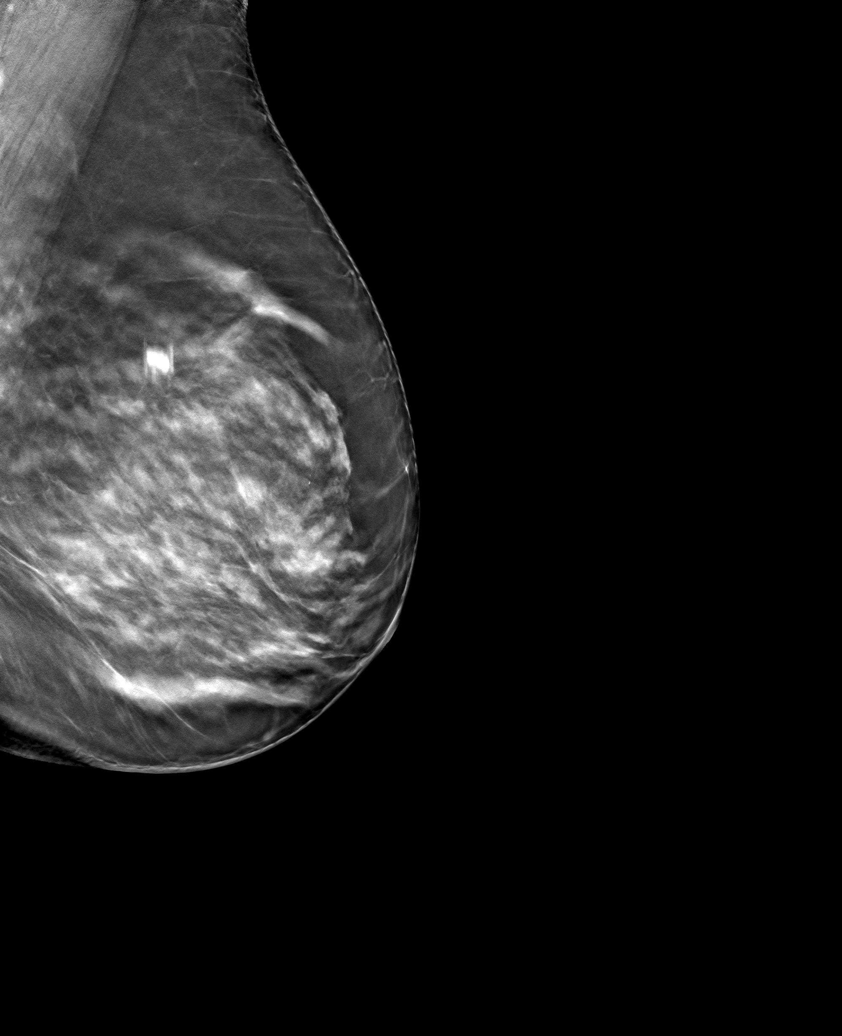

[6 of 25 positions shown; findings below may reference images not displayed]

ACR Breast Density Category c: The breast tissue is heterogeneously
dense, which may obscure small masses.
FINDINGS: Post operative changes are seen in the RIGHTbreast. No suspicious
mass, distortion, or microcalcifications are identified to suggest
presence of malignancy.
IMPRESSION: No mammographic evidence for malignancy.

RECOMMENDATION:
Diagnostic mammogram is suggested in 1 year. (Code:[id number])

I have discussed the findings and recommendations with the patient.
If applicable, a reminder letter will be sent to the patient
regarding the next appointment.

BI-RADS CATEGORY  2: Benign.

## 2023-04-08 ENCOUNTER — Other Ambulatory Visit: Payer: Medicare Other

## 2023-04-11 ENCOUNTER — Ambulatory Visit
Admission: RE | Admit: 2023-04-11 | Discharge: 2023-04-11 | Disposition: A | Discharge status: Home / Self Care | Payer: Medicare Other | Source: Ambulatory Visit | Attending: General Surgery | Admitting: General Surgery

## 2023-04-11 DIAGNOSIS — Z853 Personal history of malignant neoplasm of breast: Secondary | ICD-10-CM | POA: Insufficient documentation

## 2023-05-04 ENCOUNTER — Ambulatory Visit: Payer: Medicare Other | Admitting: Dermatology

## 2023-05-04 VITALS — BP 132/77 | HR 81

## 2023-05-04 DIAGNOSIS — L814 Other melanin hyperpigmentation: Secondary | ICD-10-CM | POA: Diagnosis not present

## 2023-05-04 DIAGNOSIS — L219 Seborrheic dermatitis, unspecified: Secondary | ICD-10-CM

## 2023-05-04 DIAGNOSIS — W908XXA Exposure to other nonionizing radiation, initial encounter: Secondary | ICD-10-CM | POA: Diagnosis not present

## 2023-05-04 DIAGNOSIS — Z1283 Encounter for screening for malignant neoplasm of skin: Secondary | ICD-10-CM

## 2023-05-04 DIAGNOSIS — L578 Other skin changes due to chronic exposure to nonionizing radiation: Secondary | ICD-10-CM | POA: Diagnosis not present

## 2023-05-04 DIAGNOSIS — D229 Melanocytic nevi, unspecified: Secondary | ICD-10-CM

## 2023-05-04 DIAGNOSIS — Z86018 Personal history of other benign neoplasm: Secondary | ICD-10-CM

## 2023-05-04 DIAGNOSIS — L821 Other seborrheic keratosis: Secondary | ICD-10-CM

## 2023-05-04 DIAGNOSIS — Z872 Personal history of diseases of the skin and subcutaneous tissue: Secondary | ICD-10-CM

## 2023-05-04 DIAGNOSIS — L603 Nail dystrophy: Secondary | ICD-10-CM

## 2023-05-04 DIAGNOSIS — Z85828 Personal history of other malignant neoplasm of skin: Secondary | ICD-10-CM

## 2023-05-04 DIAGNOSIS — I8393 Asymptomatic varicose veins of bilateral lower extremities: Secondary | ICD-10-CM

## 2023-05-04 DIAGNOSIS — D2262 Melanocytic nevi of left upper limb, including shoulder: Secondary | ICD-10-CM

## 2023-05-04 DIAGNOSIS — L719 Rosacea, unspecified: Secondary | ICD-10-CM

## 2023-05-04 MED ORDER — KETOCONAZOLE 2 % EX CREA
1.0000 | TOPICAL_CREAM | Freq: Every day | CUTANEOUS | 6 refills | Status: DC
Start: 2023-05-04 — End: 2024-06-12

## 2023-05-04 MED ORDER — CLOBETASOL PROPIONATE 0.05 % EX SOLN
CUTANEOUS | 3 refills | Status: DC
Start: 2023-05-04 — End: 2024-06-12

## 2023-05-04 NOTE — Patient Instructions (Signed)
Due to recent changes in healthcare laws, you may see results of your pathology and/or laboratory studies on MyChart before the doctors have had a chance to review them. We understand that in some cases there may be results that are confusing or concerning to you. Please understand that not all results are received at the same time and often the doctors may need to interpret multiple results in order to provide you with the best plan of care or course of treatment. Therefore, we ask that you please give us 2 business days to thoroughly review all your results before contacting the office for clarification. Should we see a critical lab result, you will be contacted sooner.   If You Need Anything After Your Visit  If you have any questions or concerns for your doctor, please call our main line at [phone] and press option 4 to reach your doctor's medical assistant. If no one answers, please leave a voicemail as directed and we will return your call as soon as possible. Messages left after 4 pm will be answered the following business day.   You may also send us a message via MyChart. We typically respond to MyChart messages within 1-2 business days.  For prescription refills, please ask your pharmacy to contact our office. Our fax number is [phone].  If you have an urgent issue when the clinic is closed that cannot wait until the next business day, you can page your doctor at the number below.    Please note that while we do our best to be available for urgent issues outside of office hours, we are not available 24/7.   If you have an urgent issue and are unable to reach us, you may choose to seek medical care at your doctor's office, retail clinic, urgent care center, or emergency room.  If you have a medical emergency, please immediately call 911 or go to the emergency department.  Pager Numbers  - Dr. Kowalski: [phone]  - Dr. Moye: [phone]  - Dr. Stewart:  [phone]  In the event of inclement weather, please call our main line at [phone] for an update on the status of any delays or closures.  Dermatology Medication Tips: Please keep the boxes that topical medications come in in order to help keep track of the instructions about where and how to use these. Pharmacies typically print the medication instructions only on the boxes and not directly on the medication tubes.   If your medication is too expensive, please contact our office at [phone] option 4 or send us a message through MyChart.   We are unable to tell what your co-pay for medications will be in advance as this is different depending on your insurance coverage. However, we may be able to find a substitute medication at lower cost or fill out paperwork to get insurance to cover a needed medication.   If a prior authorization is required to get your medication covered by your insurance company, please allow us 1-2 business days to complete this process.  Drug prices often vary depending on where the prescription is filled and some pharmacies may offer cheaper prices.  The website www.goodrx.com contains coupons for medications through different pharmacies. The prices here do not account for what the cost may be with help from insurance (it may be cheaper with your insurance), but the website can give you the price if you did not use any insurance.  - You can print the associated coupon and take it with   your prescription to the pharmacy.  - You may also stop by our office during regular business hours and pick up a GoodRx coupon card.  - If you need your prescription sent electronically to a different pharmacy, notify our office through [facility] MyChart or by phone at [phone] option 4.     Si Usted Necesita Algo Despus de Su Visita  Tambin puede enviarnos un mensaje a travs de MyChart. Por lo general respondemos a los mensajes de MyChart en el transcurso de 1 a 2  das hbiles.  Para renovar recetas, por favor pida a su farmacia que se ponga en contacto con nuestra oficina. Nuestro nmero de fax es el [phone].  Si tiene un asunto urgente cuando la clnica est cerrada y que no puede esperar hasta el siguiente da hbil, puede llamar/localizar a su doctor(a) al nmero que aparece a continuacin.   Por favor, tenga en cuenta que aunque hacemos todo lo posible para estar disponibles para asuntos urgentes fuera del horario de oficina, no estamos disponibles las 24 horas del da, los 7 das de la semana.   Si tiene un problema urgente y no puede comunicarse con nosotros, puede optar por buscar atencin mdica  en el consultorio de su doctor(a), en una clnica privada, en un centro de atencin urgente o en una sala de emergencias.  Si tiene una emergencia mdica, por favor llame inmediatamente al 911 o vaya a la sala de emergencias.  Nmeros de bper  - Dr. Kowalski: [phone]  - Dra. Moye: [phone]  - Dra. Stewart: [phone]  En caso de inclemencias del tiempo, por favor llame a nuestra lnea principal al [phone] para una actualizacin sobre el estado de cualquier retraso o cierre.  Consejos para la medicacin en dermatologa: Por favor, guarde las cajas en las que vienen los medicamentos de uso tpico para ayudarle a seguir las instrucciones sobre dnde y cmo usarlos. Las farmacias generalmente imprimen las instrucciones del medicamento slo en las cajas y no directamente en los tubos del medicamento.   Si su medicamento es muy caro, por favor, pngase en contacto con nuestra oficina llamando al [phone] y presione la opcin 4 o envenos un mensaje a travs de MyChart.   No podemos decirle cul ser su copago por los medicamentos por adelantado ya que esto es diferente dependiendo de la cobertura de su seguro. Sin embargo, es posible que podamos encontrar un medicamento sustituto a menor costo o llenar un formulario para que el  seguro cubra el medicamento que se considera necesario.   Si se requiere una autorizacin previa para que su compaa de seguros cubra su medicamento, por favor permtanos de 1 a 2 das hbiles para completar este proceso.  Los precios de los medicamentos varan con frecuencia dependiendo del lugar de dnde se surte la receta y alguna farmacias pueden ofrecer precios ms baratos.  El sitio web www.goodrx.com tiene cupones para medicamentos de diferentes farmacias. Los precios aqu no tienen en cuenta lo que podra costar con la ayuda del seguro (puede ser ms barato con su seguro), pero el sitio web puede darle el precio si no utiliz ningn seguro.  - Puede imprimir el cupn correspondiente y llevarlo con su receta a la farmacia.  - Tambin puede pasar por nuestra oficina durante el horario de atencin regular y recoger una tarjeta de cupones de GoodRx.  - Si necesita que su receta se enve electrnicamente a una farmacia diferente, informe a nuestra oficina a travs de MyChart de [facility]   o por telfono llamando al [phone] y presione la opcin 4.  

## 2023-05-04 NOTE — Progress Notes (Signed)
Follow-Up Visit   Subjective  Kristen Singh is a 82 y.o. female who presents for the following: Skin Cancer Screening and Full Body Skin Exam  The patient presents for Total-Body Skin Exam (TBSE) for skin cancer screening and mole check. The patient has spots, moles and lesions to be evaluated, some may be new or changing and the patient may have concern these could be cancer.   The following portions of the chart were reviewed this encounter and updated as appropriate: medications, allergies, medical history  Review of Systems:  No other skin or systemic complaints except as noted in HPI or Assessment and Plan.  Objective  Well appearing patient in no apparent distress; mood and affect are within normal limits.  A full examination was performed including scalp, head, eyes, ears, nose, lips, neck, chest, axillae, abdomen, back, buttocks, bilateral upper extremities, bilateral lower extremities, hands, feet, fingers, toes, fingernails, and toenails. All findings within normal limits unless otherwise noted below.   Relevant physical exam findings are noted in the Assessment and Plan.    Assessment & Plan   SKIN CANCER SCREENING PERFORMED TODAY.  ACTINIC DAMAGE - Chronic condition, secondary to cumulative UV/sun exposure - diffuse scaly erythematous macules with underlying dyspigmentation - Recommend daily broad spectrum sunscreen SPF 30+ to sun-exposed areas, reapply every 2 hours as needed.  - Staying in the shade or wearing long sleeves, sun glasses (UVA+UVB protection) and wide brim hats (4-inch brim around the entire circumference of the hat) are also recommended for sun protection.  - Call for new or changing lesions.  LENTIGINES, SEBORRHEIC KERATOSES, HEMANGIOMAS - Benign normal skin lesions - Benign-appearing - Call for any changes  MELANOCYTIC NEVI - Tan-brown and/or pink-flesh-colored symmetric macules and papules - Benign appearing on exam today - Observation -  Call clinic for new or changing moles - Recommend daily use of broad spectrum spf 30+ sunscreen to sun-exposed areas.  NEVUS - L post shoulder  Exam: 0.3 cm medium dark brown macule Treatment Plan: Benign appearing on exam today. Recommend observation. Call clinic for new or changing moles. Recommend daily use of broad spectrum spf 30+ sunscreen to sun-exposed areas.   HISTORY OF BASAL CELL CARCINOMA OF THE SKIN - No evidence of recurrence today - Recommend regular full body skin exams - Recommend daily broad spectrum sunscreen SPF 30+ to sun-exposed areas, reapply every 2 hours as needed.  - Call if any new or changing lesions are noted between office visits  HISTORY OF DYSPLASTIC NEVUS No evidence of recurrence today Recommend regular full body skin exams Recommend daily broad spectrum sunscreen SPF 30+ to sun-exposed areas, reapply every 2 hours as needed.  Call if any new or changing lesions are noted between office visits   Seborrheic dermatitis scalp, face  Exam: Mild erythema of the crown. Face clear today. Pt c/o itching   Chronic and persistent condition with duration or expected duration over one year. Condition is symptomatic/ bothersome to patient. Not currently at goal.    Seborrheic Dermatitis  -  is a chronic persistent rash characterized by pinkness and scaling most commonly of the mid face but also can occur on the scalp (dandruff), ears; mid chest, mid back and groin.  It tends to be exacerbated by stress and cooler weather.  People who have neurologic disease may experience new onset or exacerbation of existing seborrheic dermatitis.  The condition is not curable but treatable and can be controlled.   Patient hasn't noticed an improvement with Ketoconazole  cream and Ketoconazole shampoo.  Continue OTC Head and Shoulders Clinical shampoo daily. Pt defers rx shampoo today. She will call for Ciclopirox shampoo prescription if she thinks she needs it.   Continue  Clobetasol solution to aa's scalp QD-BID PRN itch.    Topical steroids (such as triamcinolone, fluocinolone, fluocinonide, mometasone, clobetasol, halobetasol, betamethasone, hydrocortisone) can cause thinning and lightening of the skin if they are used for too long in the same area. Your physician has selected the right strength medicine for your problem and area affected on the body. Please use your medication only as directed by your physician to prevent side effects.   Seborrheic dermatitis  Related Medications clobetasol (TEMOVATE) 0.05 % external solution Apply to aa's scalp QD-BID PRN itch. Avoid applying to face, groin, and axilla. Use as directed. Long-term use can cause thinning of the skin.  ketoconazole (NIZORAL) 2 % cream Apply 1 Application topically daily. Qd to scaly areas face, eyebrows as needed    Varicose Veins/Spider Veins - Dilated blue, purple or red veins at the lower extremities - Reassured - Smaller vessels can be treated by sclerotherapy (a procedure to inject a medicine into the veins to make them disappear) if desired, but the treatment is not covered by insurance. Larger vessels may be covered if symptomatic and we would refer to vascular surgeon if treatment desired.  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  Growing out, with some clearing at base.  Treatment Plan: Benign-appearing.  Observation.  May continue to improve over time.  Call clinic for new or changing lesions.    Hx inflamed seborrheic keratosis L upper elbow- clear   No longer inflamed and benign appearing. No tx needed.   Rosacea face   Chronic condition with duration or expected duration over one year. Currently well-controlled.    Rosacea is a chronic progressive skin condition usually affecting the face of adults, causing  redness and/or acne bumps. It is treatable but not curable. It sometimes affects the eyes (ocular rosacea) as well. It may respond to topical and/or systemic medication and can flare with stress, sun exposure, alcohol, exercise and some foods.  Daily application of broad spectrum spf 30+ sunscreen to face is recommended to reduce flares.  Continue Metronidazole cream prn flares.    Return in about 1 year (around 05/03/2024) for TBSE.  Maylene Roes, CMA, am acting as scribe for Willeen Niece, MD .   Documentation: I have reviewed the above documentation for accuracy and completeness, and I agree with the above.  Willeen Niece, MD

## 2023-05-23 ENCOUNTER — Encounter: Payer: Medicare Other | Admitting: Dermatology

## 2024-02-20 ENCOUNTER — Other Ambulatory Visit: Payer: Self-pay | Admitting: General Surgery

## 2024-02-20 DIAGNOSIS — Z1231 Encounter for screening mammogram for malignant neoplasm of breast: Secondary | ICD-10-CM

## 2024-03-26 ENCOUNTER — Telehealth: Payer: Self-pay

## 2024-03-26 NOTE — Telephone Encounter (Signed)
Left voicemail returning patient's phone call

## 2024-03-26 NOTE — Telephone Encounter (Signed)
 Patient called she would like to walk in the office and let Dr Annette Barters look at a spot that recently appeared, discussed with pt I could work her in the schedule June 11 with Dr Annette Barters, pt decline that appt

## 2024-04-02 ENCOUNTER — Ambulatory Visit: Attending: Student

## 2024-04-02 DIAGNOSIS — M542 Cervicalgia: Secondary | ICD-10-CM | POA: Insufficient documentation

## 2024-04-02 NOTE — Therapy (Signed)
 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   Atypical mole 03/29/2017   right medial shoulder/mild   Basal cell carcinoma 04/10/2019   left lower pretibial   Carpal tunnel syndrome    Cortical cataract    DDD (degenerative disc disease), cervical    cervical   DDD (degenerative disc disease), lumbar    lumbar   Depression    DOE (dyspnea on exertion)    Dyspnea    with exertion   GERD (gastroesophageal reflux disease)    Headache    Hx - no current problems   History of kidney stones    passed stones, no surgery   HLD (hyperlipidemia)    Hypertension    Migraines    Hx - no current problems   Osteopenia    Past Surgical History:  Procedure Laterality Date   BREAST BIOPSY Right 02/21/2020   affirm bx calcs, x marker, mild stromal fibrosis, benign, no atypia   BREAST BIOPSY Right 04/06/2021   right breast UIQ calcs, coil marker --> path showed COLUMNAR CELL CHANGE, FIBROADENOMATOID CHANGES, SCLEROSING ADENOSIS, LOBULAR NEOPLASIA   BREAST BIOPSY Right 04/06/2021   UIQ calcs, ribbon marker --> path showed FOCAL ATYPICAL DUCTAL HYPERPLASIA (ADH) IN A BACKGROUND OF COLUMNAR CELL CHANGE WITH USUAL DUCTAL HYPERPLASIA, APOCRINE METAPLASIA, SCLEROSING ADENOSIS, LOBULAR NEOPLASIA   CATARACT EXTRACTION, BILATERAL N/A    CHOLECYSTECTOMY     COLONOSCOPY     x several   EXCISION  OF BREAST BIOPSY Right 05/08/2021   Procedure: EXCISION OF BREAST BIOPSY W/ Needle localization;  Surgeon: Eldred Grego, MD;  Location: [facility];  Service: General;  Laterality: Right;   TONSILLECTOMY     UPPER GI ENDOSCOPY     x several   WISDOM TOOTH EXTRACTION     Patient Active Problem List   Diagnosis Date Noted   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   Hand dominance: Right  PERTINENT HISTORY:   Cervicalgia. Neck pain began around the 1990's to 2000s. Acupuncture helped. Wakes up at times with some soreness. Not bothering her as much now.   Does not think she needs PT for her neck. Just has difficulty getting up and down onto the ground and getting back up. Legs are not as strong since her surgery in her back Feb 24, 2023.     Pt states back gets sore.    No latex allergies Blood pressure is controlled per pt.     PAIN:  Are you having pain? Yes: NPRS scale: 0/10 currently  Pain location: neck Pain description:   Aggravating factors:   Relieving factors: leaning back and supporting her neck.   PRECAUTIONS: None  RED FLAGS: None     WEIGHT BEARING RESTRICTIONS: No  FALLS:  Has patient fallen in last 6 months? No  LIVING ENVIRONMENT: Lives with:  Lives in:  Stairs:  Has following equipment at home:   OCCUPATION:   PLOF:   PATIENT GOALS:   NEXT MD VISIT:   OBJECTIVE:  Note:  Objective measures were completed at Evaluation unless otherwise noted.  DIAGNOSTIC FINDINGS:    PATIENT SURVEYS:    COGNITION: Overall cognitive status: Within functional limits for tasks assessed  SENSATION:   POSTURE:   PALPATION:    CERVICAL ROM:   Active ROM A/PROM (deg) eval  Flexion   Extension   Right lateral flexion   Left lateral flexion   Right rotation   Left rotation    (Blank rows = not tested)  UPPER EXTREMITY ROM:  Active ROM Right eval Left eval  Shoulder flexion    Shoulder extension    Shoulder abduction    Shoulder adduction    Shoulder extension    Shoulder internal rotation    Shoulder external rotation    Elbow flexion    Elbow extension    Wrist flexion    Wrist extension    Wrist ulnar deviation    Wrist radial deviation    Wrist pronation    Wrist supination     (Blank rows = not tested)  UPPER EXTREMITY MMT:  MMT Right eval Left eval  Shoulder flexion    Shoulder extension    Shoulder abduction    Shoulder adduction    Shoulder extension    Shoulder internal rotation    Shoulder external rotation    Middle trapezius    Lower trapezius    Elbow flexion    Elbow extension    Wrist flexion    Wrist extension    Wrist ulnar deviation    Wrist radial deviation    Wrist pronation    Wrist supination    Grip strength     (Blank rows = not tested)  CERVICAL SPECIAL TESTS:    FUNCTIONAL TESTS:    TREATMENT DATE: 04/02/2024  PATIENT EDUCATION:  Education details:  Person educated:  International aid/development worker:  Education comprehension:   HOME EXERCISE PROGRAM:   ASSESSMENT:  CLINICAL IMPRESSION: Patient is a 83 y.o. female who was seen today for physical therapy evaluation and treatment for cervicalgia. Per pt, neck is not really bothering her and does not think she needs physical therapy  for her neck. Pt states having some difficulty getting down on the ground and getting back up with her LE strength. Pt was recommended to contact her doctor for a PT referral for balance and LE strengthening if she wants to work on that since the prescription was specifically for neck pain. Pt verbalized understanding.       OBJECTIVE IMPAIRMENTS: .   ACTIVITY LIMITATIONS:   PARTICIPATION LIMITATIONS:   PERSONAL FACTORS:  are also affecting patient's functional outcome.   REHAB POTENTIAL:   CLINICAL DECISION MAKING:   EVALUATION COMPLEXITY:    GOALS: Goals reviewed with patient?   SHORT TERM GOALS: Target date:    LONG TERM GOALS: Target date:    PLAN:  PT FREQUENCY:   PT DURATION:   PLANNED INTERVENTIONS:   PLAN FOR NEXT SESSION:    Thank you for your referral.    Rowan Pollman, PT, DPT 04/02/2024, 3:50 PM

## 2024-04-04 ENCOUNTER — Ambulatory Visit

## 2024-04-10 ENCOUNTER — Ambulatory Visit

## 2024-04-12 ENCOUNTER — Ambulatory Visit

## 2024-04-12 ENCOUNTER — Ambulatory Visit
Admission: RE | Admit: 2024-04-12 | Discharge: 2024-04-12 | Disposition: A | Discharge status: Home / Self Care | Source: Ambulatory Visit | Attending: General Surgery | Admitting: General Surgery

## 2024-04-12 DIAGNOSIS — Z1231 Encounter for screening mammogram for malignant neoplasm of breast: Secondary | ICD-10-CM | POA: Diagnosis present

## 2024-04-16 ENCOUNTER — Encounter

## 2024-04-18 ENCOUNTER — Encounter

## 2024-04-23 ENCOUNTER — Encounter

## 2024-04-25 ENCOUNTER — Encounter

## 2024-05-02 ENCOUNTER — Encounter

## 2024-05-04 ENCOUNTER — Encounter

## 2024-05-09 ENCOUNTER — Encounter

## 2024-05-11 ENCOUNTER — Encounter

## 2024-05-14 ENCOUNTER — Encounter

## 2024-05-16 ENCOUNTER — Encounter

## 2024-05-23 ENCOUNTER — Encounter

## 2024-05-25 ENCOUNTER — Encounter

## 2024-05-28 ENCOUNTER — Encounter

## 2024-05-30 ENCOUNTER — Encounter

## 2024-06-12 ENCOUNTER — Ambulatory Visit: Payer: Medicare Other | Admitting: Dermatology

## 2024-06-12 DIAGNOSIS — Z1283 Encounter for screening for malignant neoplasm of skin: Secondary | ICD-10-CM

## 2024-06-12 DIAGNOSIS — W908XXA Exposure to other nonionizing radiation, initial encounter: Secondary | ICD-10-CM | POA: Diagnosis not present

## 2024-06-12 DIAGNOSIS — Z7189 Other specified counseling: Secondary | ICD-10-CM

## 2024-06-12 DIAGNOSIS — D1801 Hemangioma of skin and subcutaneous tissue: Secondary | ICD-10-CM

## 2024-06-12 DIAGNOSIS — L603 Nail dystrophy: Secondary | ICD-10-CM

## 2024-06-12 DIAGNOSIS — L578 Other skin changes due to chronic exposure to nonionizing radiation: Secondary | ICD-10-CM

## 2024-06-12 DIAGNOSIS — L219 Seborrheic dermatitis, unspecified: Secondary | ICD-10-CM

## 2024-06-12 DIAGNOSIS — L719 Rosacea, unspecified: Secondary | ICD-10-CM

## 2024-06-12 DIAGNOSIS — L821 Other seborrheic keratosis: Secondary | ICD-10-CM

## 2024-06-12 DIAGNOSIS — Z86018 Personal history of other benign neoplasm: Secondary | ICD-10-CM

## 2024-06-12 DIAGNOSIS — L814 Other melanin hyperpigmentation: Secondary | ICD-10-CM

## 2024-06-12 DIAGNOSIS — L853 Xerosis cutis: Secondary | ICD-10-CM

## 2024-06-12 DIAGNOSIS — Z85828 Personal history of other malignant neoplasm of skin: Secondary | ICD-10-CM

## 2024-06-12 DIAGNOSIS — D229 Melanocytic nevi, unspecified: Secondary | ICD-10-CM

## 2024-06-12 MED ORDER — CLOBETASOL PROPIONATE 0.05 % EX SOLN
CUTANEOUS | 5 refills | Status: AC
Start: 2024-06-12 — End: ?

## 2024-06-12 MED ORDER — CICLOPIROX 1 % EX SHAM
MEDICATED_SHAMPOO | CUTANEOUS | 11 refills | Status: AC
Start: 2024-06-12 — End: ?

## 2024-06-12 MED ORDER — KETOCONAZOLE 2 % EX CREA
1.0000 | TOPICAL_CREAM | Freq: Every day | CUTANEOUS | 6 refills | Status: AC
Start: 2024-06-12 — End: 2025-08-06

## 2024-06-12 NOTE — Patient Instructions (Addendum)
 Seborrheic Dermatitis is a chronic persistent rash characterized by pinkness and scaling most commonly of the mid face but also can occur on the scalp (dandruff), ears; mid chest, mid back and groin.  It tends to be exacerbated by stress and cooler weather.  People who have neurologic disease may experience new onset or exacerbation of existing seborrheic dermatitis.  The condition is not curable but treatable and can be controlled. Ketoconazole  2% cream - Apply to itchy, flaky areas on the face once to twice daily, as needed.  Clobetasol  solution - Apply 2-3 drops to affected areas scalp up to twice daily as needed for itch. Avoid applying to face, groin, and axilla. Use as directed. Long-term use can cause thinning of the skin. Start Ciclopirox  shampoo - massage into scalp and let sit several minutes before rinsing.    Counseling for BBL / IPL / Laser and Coordination of Care Discussed the treatment option of Broad Band Light (BBL) /Intense Pulsed Light (IPL)/ Laser for skin discoloration, including brown spots and redness.  Typically we recommend at least 1-3 treatment sessions about 5-8 weeks apart for best results.  Cannot have tanned skin when BBL performed, and regular use of sunscreen/photoprotection is advised after the procedure to help maintain results. The patient's condition may also require maintenance treatments in the future.  The fee for BBL / laser treatments is $350 per treatment session for the whole face.  A fee can be quoted for other parts of the body.  Insurance typically does not pay for BBL/laser treatments and therefore the fee is an out-of-pocket cost. Recommend prophylactic valtrex treatment. Once scheduled for procedure, will send Rx in prior to patient's appointment.    Gentle Skin Care Guide  1. Bathe no more than once a day.  2. Avoid bathing in hot water   3. Use a mild soap like Dove, Vanicream, Cetaphil, CeraVe. Can use Lever 2000 or Cetaphil antibacterial  soap  4. Use soap only where you need it. On most days, use it under your arms, between your legs, and on your feet. Let the water  rinse other areas unless visibly dirty.  5. When you get out of the bath/shower, use a towel to gently blot your skin dry, don't rub it.  6. While your skin is still a little damp, apply a moisturizing cream such as Vanicream, CeraVe, Cetaphil, Eucerin, Sarna lotion or plain Vaseline Jelly. For hands apply Neutrogena [place] Hand Cream or Excipial Hand Cream.  7. Reapply moisturizer any time you start to itch or feel dry.  8. Sometimes using free and clear laundry detergents can be helpful. Fabric softener sheets should be avoided. Downy Free & Gentle liquid, or any liquid fabric softener that is free of dyes and perfumes, it acceptable to use  9. If your doctor has given you prescription creams you may apply moisturizers over them      Seborrheic Keratosis  What causes seborrheic keratoses? Seborrheic keratoses are harmless, common skin growths that first appear during adult life.  As time goes by, more growths appear.  Some people may develop a large number of them.  Seborrheic keratoses appear on both covered and uncovered body parts.  They are not caused by sunlight.  The tendency to develop seborrheic keratoses can be inherited.  They vary in color from skin-colored to gray, brown, or even black.  They can be either smooth or have a rough, warty surface.   Seborrheic keratoses are superficial and look as if they were stuck on  the skin.  Under the microscope this type of keratosis looks like layers upon layers of skin.  That is why at times the top layer may seem to fall off, but the rest of the growth remains and re-grows.    Treatment Seborrheic keratoses do not need to be treated, but can easily be removed in the office.  Seborrheic keratoses often cause symptoms when they rub on clothing or jewelry.  Lesions can be in the way of shaving.  If they become  inflamed, they can cause itching, soreness, or burning.  Removal of a seborrheic keratosis can be accomplished by freezing, burning, or surgery. If any spot bleeds, scabs, or grows rapidly, please return to have it checked, as these can be an indication of a skin cancer.  Due to recent changes in healthcare laws, you may see results of your pathology and/or laboratory studies on MyChart before the doctors have had a chance to review them. We understand that in some cases there may be results that are confusing or concerning to you. Please understand that not all results are received at the same time and often the doctors may need to interpret multiple results in order to provide you with the best plan of care or course of treatment. Therefore, we ask that you please give us  2 business days to thoroughly review all your results before contacting the office for clarification. Should we see a critical lab result, you will be contacted sooner.   If You Need Anything After Your Visit  If you have any questions or concerns for your doctor, please call our main line at [phone] and press option 4 to reach your doctor's medical assistant. If no one answers, please leave a voicemail as directed and we will return your call as soon as possible. Messages left after 4 pm will be answered the following business day.   You may also send us  a message via MyChart. We typically respond to MyChart messages within 1-2 business days.  For prescription refills, please ask your pharmacy to contact our office. Our fax number is [phone].  If you have an urgent issue when the clinic is closed that cannot wait until the next business day, you can page your doctor at the number below.    Please note that while we do our best to be available for urgent issues outside of office hours, we are not available 24/7.   If you have an urgent issue and are unable to reach us , you may choose to seek medical care at your  doctor's office, retail clinic, urgent care center, or emergency room.  If you have a medical emergency, please immediately call 911 or go to the emergency department.  Pager Numbers  - Dr. Hester: [phone]  - Dr. Jackquline: [phone]  - Dr. Claudene: [phone]   In the event of inclement weather, please call our main line at [phone] for an update on the status of any delays or closures.  Dermatology Medication Tips: Please keep the boxes that topical medications come in in order to help keep track of the instructions about where and how to use these. Pharmacies typically print the medication instructions only on the boxes and not directly on the medication tubes.   If your medication is too expensive, please contact our office at [phone] option 4 or send us  a message through MyChart.   We are unable to tell what your co-pay for medications will be in advance as this is different depending on  your insurance coverage. However, we may be able to find a substitute medication at lower cost or fill out paperwork to get insurance to cover a needed medication.   If a prior authorization is required to get your medication covered by your insurance company, please allow us  1-2 business days to complete this process.  Drug prices often vary depending on where the prescription is filled and some pharmacies may offer cheaper prices.  The website www.goodrx.com contains coupons for medications through different pharmacies. The prices here do not account for what the cost may be with help from insurance (it may be cheaper with your insurance), but the website can give you the price if you did not use any insurance.  - You can print the associated coupon and take it with your prescription to the pharmacy.  - You may also stop by our office during regular business hours and pick up a GoodRx coupon card.  - If you need your prescription sent electronically to a different pharmacy, notify  our office through [facility] or by phone at [phone] option 4.     Si Usted Necesita Algo Despus de Su Visita  Tambin puede enviarnos un mensaje a travs de Clinical cytogeneticist. Por lo general respondemos a los mensajes de MyChart en el transcurso de 1 a 2 das hbiles.  Para renovar recetas, por favor pida a su farmacia que se ponga en contacto con nuestra oficina. Randi lakes de fax es [place] [phone].  Si tiene un asunto urgente cuando la clnica est cerrada y que no puede esperar hasta el siguiente da hbil, puede llamar/localizar a su doctor(a) al nmero que aparece a continuacin.   Por favor, tenga en cuenta que aunque hacemos todo lo posible para estar disponibles para asuntos urgentes fuera del horario de [place], no estamos disponibles las 24 horas del da, los 7 [address]  [address] de la [place].   Si tiene un problema urgente y no puede comunicarse con nosotros, puede optar por buscar atencin mdica  en el consultorio de su doctor(a), en una clnica privada, en un centro de atencin urgente o en una sala de emergencias.  Si tiene Engineer, drilling, por favor llame inmediatamente al 911 o vaya a la sala de emergencias.  Nmeros de bper  - Dr. Hester: [phone]  - Dra. Jackquline: [phone]  - Dr. Claudene: [phone]   En caso de inclemencias del tiempo, por favor llame a landry capes principal al [phone] para una actualizacin sobre el [place] de cualquier retraso o cierre.  Consejos para la medicacin en dermatologa: Por favor, guarde las cajas en las que vienen los medicamentos de uso tpico para ayudarle a seguir las instrucciones sobre dnde y cmo usarlos. Las farmacias generalmente imprimen las instrucciones del medicamento slo en las cajas y no directamente en los tubos del [place].   Si su medicamento es muy caro, por favor, pngase en contacto con landry rieger llamando al [phone] y presione la opcin 4 o envenos un mensaje a travs de  Clinical cytogeneticist.   No podemos decirle cul ser su copago por los medicamentos por adelantado ya que esto es diferente dependiendo de la cobertura de su seguro. Sin embargo, es posible que podamos encontrar un medicamento sustituto a Audiological scientist un formulario para que el seguro cubra el medicamento que se considera necesario.   Si se requiere una autorizacin previa para que su compaa de seguros malta su medicamento, por favor permtanos de 1 a 2 das hbiles para completar este proceso.  Los precios de los medicamentos varan con frecuencia dependiendo del Environmental consultant de dnde se surte la receta y alguna farmacias pueden ofrecer precios ms baratos.  El sitio web www.goodrx.com tiene cupones para medicamentos de Health and safety inspector. Los precios aqu no tienen en cuenta lo que podra costar con la ayuda del seguro (puede ser ms barato con su seguro), pero el sitio web puede darle el precio si no utiliz Tourist information centre manager.  - Puede imprimir el cupn correspondiente y llevarlo con su receta a la farmacia.  - Tambin puede pasar por nuestra oficina durante el horario de atencin regular y Education officer, museum una tarjeta de cupones de GoodRx.  - Si necesita que su receta se enve electrnicamente a una farmacia diferente, informe a nuestra oficina a travs de MyChart de [place] o por telfono llamando al [phone] y presione la opcin 4.

## 2024-06-12 NOTE — Progress Notes (Signed)
 Follow-Up Visit   Subjective  Kristen Singh is a 83 y.o. female who presents for the following: Skin Cancer Screening and Full Body Skin Exam  The patient presents for Total-Body Skin Exam (TBSE) for skin cancer screening and mole check. The patient has spots, moles and lesions to be evaluated, some may be new or changing. She has seborrheic dermatitis of the scalp, not much improvement and very itchy. She is using Head [employer] Shoulders clinical shampoo. She ran out of clobetasol  solution.    The following portions of the chart were reviewed this encounter and updated as appropriate: medications, allergies, medical history  Review of Systems:  No other skin or systemic complaints except as noted in HPI or Assessment and Plan.  Objective  Well appearing patient in no apparent distress; mood and affect are within normal limits.  A full examination was performed including scalp, head, eyes, ears, nose, lips, neck, chest, axillae, abdomen, back, buttocks, bilateral upper extremities, bilateral lower extremities, hands, feet, fingers, toes, fingernails, and toenails. All findings within normal limits unless otherwise noted below.   Relevant physical exam findings are noted in the Assessment and Plan.    Assessment & Plan   SKIN CANCER SCREENING PERFORMED TODAY.  ACTINIC DAMAGE - Chronic condition, secondary to cumulative UV/sun exposure - diffuse scaly erythematous macules with underlying dyspigmentation - Recommend daily broad spectrum sunscreen SPF 30+ to sun-exposed areas, reapply every 2 hours as needed.  - Staying in the shade or wearing long sleeves, sun glasses (UVA+UVB protection) and wide brim hats (4-inch brim around the entire circumference of the hat) are also recommended for sun protection.  - Call for new or changing lesions.  LENTIGINES, SEBORRHEIC KERATOSES, HEMANGIOMAS - Benign normal skin lesions - Benign-appearing - Call for any changes  MELANOCYTIC NEVI -  Tan-brown and/or pink-flesh-colored symmetric macules and papules - Left posterior shoulder 0.3 cm medium dark brown macule  - Right lower back 1.0 cm pink tan papule - Benign appearing on exam today - Observation - Call clinic for new or changing moles - Recommend daily use of broad spectrum spf 30+ sunscreen to sun-exposed areas.   HISTORY OF BASAL CELL CARCINOMA OF THE SKIN Left lower pretibial, 2020 - No evidence of recurrence today - Recommend regular full body skin exams - Recommend daily broad spectrum sunscreen SPF 30+ to sun-exposed areas, reapply every 2 hours as needed.  - Call if any new or changing lesions are noted between office visits  History of Dysplastic Nevi - No evidence of recurrence today - Recommend regular full body skin exams - Recommend daily broad spectrum sunscreen SPF 30+ to sun-exposed areas, reapply every 2 hours as needed.  - Call if any new or changing lesions are noted between office visits  SEBORRHEIC DERMATITIS Exam: Mild erythema and scale of the scalp. Pt c/o of worsening itching  Chronic and persistent condition with duration or expected duration over one year. Condition is bothersome/symptomatic for patient. Currently flared.    Seborrheic Dermatitis is a chronic persistent rash characterized by pinkness and scaling most commonly of the mid face but also can occur on the scalp (dandruff), ears; mid chest, mid back and groin.  It tends to be exacerbated by stress and cooler weather.  People who have neurologic disease may experience new onset or exacerbation of existing seborrheic dermatitis.  The condition is not curable but treatable and can be controlled.  Treatment Plan: Restart Ciclopirox  shampoo - massage into scalp and let sit several minutes  before rinsing Restart Clobetasol  solution apply 2-3 drops to aa scalp prn itch. Avoid applying to face, groin, and axilla. Use as directed. Long-term use can cause thinning of the skin. Continue  ketoconazole  2% cream once to twice daily to itchy, flaky areas on face.   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  Growing out, with some clearing at base.   Treatment Plan: Benign-appearing.  Observation.  May continue to improve over time.  Call clinic for new or changing lesions.    ROSACEA Exam Mild erythema cheeks.   Chronic condition with duration or expected duration over one year. Currently well-controlled off treatment.  Rosacea is a chronic progressive skin condition usually affecting the face of adults, causing redness and/or acne bumps. It is treatable but not curable. It sometimes affects the eyes (ocular rosacea) as well. It may respond to topical and/or systemic medication and can flare with stress, sun exposure, alcohol, exercise, topical steroids (including hydrocortisone/cortisone 10) and some foods.  Daily application of broad spectrum spf 30+ sunscreen to face is recommended to reduce flares.   Treatment Plan Mild, no treatment.   Counseling for BBL / IPL / Laser and Coordination of Care Discussed the treatment option of Broad Band Light (BBL) /Intense Pulsed Light (IPL)/ Laser for skin discoloration, including brown spots and redness.  Typically we recommend at least 1-3 treatment sessions about 5-8 weeks apart for best results.  Cannot have tanned skin when BBL performed, and regular use of sunscreen/photoprotection is advised after the procedure to help maintain results. The patient's condition may also require maintenance treatments in the future.  The fee for BBL / laser treatments is $350 per treatment session for  the whole face.  A fee can be quoted for other parts of the body.  Insurance typically does not pay for BBL/laser treatments and therefore the fee is an out-of-pocket cost. Recommend prophylactic valtrex treatment. Once scheduled for procedure, will send Rx in prior to patient's appointment.    SEBORRHEIC DERMATITIS   Related Medications Ciclopirox  1 % shampoo Massage into scalp and let sit several minutes before rinsing. Use 2-3 times a week. clobetasol  (TEMOVATE ) 0.05 % external solution Apply to aa's scalp QD-BID PRN itch. Avoid applying to face, groin, and axilla. Use as directed. Long-term use can cause thinning of the skin. ketoconazole  (NIZORAL ) 2 % cream Apply 1 Application topically daily. Qd to scaly areas face, eyebrows as needed Return in about 1 year (around 06/12/2025) for TBSE, Hx BCC, Hx Dysplastic Nevus.  IAndrea Kerns, CMA, am acting as scribe for Rexene Rattler, MD .   Documentation: I have reviewed the above documentation for accuracy and completeness, and I agree with the above.  Rexene Rattler, MD

## 2025-06-18 ENCOUNTER — Encounter: Admitting: Dermatology
